# Patient Record
Sex: Male | Born: 1965 | Race: White | Hispanic: No | Marital: Single | Smoking: Current every day smoker
Health system: Southern US, Community
[De-identification: ages and names within clinical notes are randomized; demographics above are authoritative.]

## PROBLEM LIST (undated history)

## (undated) DIAGNOSIS — N182 Chronic kidney disease, stage 2 (mild): Secondary | ICD-10-CM

## (undated) DIAGNOSIS — I4891 Unspecified atrial fibrillation: Secondary | ICD-10-CM

## (undated) DIAGNOSIS — K056 Periodontal disease, unspecified: Secondary | ICD-10-CM

## (undated) DIAGNOSIS — E785 Hyperlipidemia, unspecified: Secondary | ICD-10-CM

## (undated) DIAGNOSIS — N281 Cyst of kidney, acquired: Secondary | ICD-10-CM

## (undated) DIAGNOSIS — I219 Acute myocardial infarction, unspecified: Secondary | ICD-10-CM

## (undated) DIAGNOSIS — J439 Emphysema, unspecified: Secondary | ICD-10-CM

## (undated) DIAGNOSIS — M109 Gout, unspecified: Secondary | ICD-10-CM

## (undated) DIAGNOSIS — I10 Essential (primary) hypertension: Secondary | ICD-10-CM

## (undated) DIAGNOSIS — I6529 Occlusion and stenosis of unspecified carotid artery: Secondary | ICD-10-CM

## (undated) DIAGNOSIS — R51 Headache: Secondary | ICD-10-CM

## (undated) DIAGNOSIS — R000 Tachycardia, unspecified: Secondary | ICD-10-CM

## (undated) DIAGNOSIS — I7090 Unspecified atherosclerosis: Secondary | ICD-10-CM

## (undated) DIAGNOSIS — I739 Peripheral vascular disease, unspecified: Secondary | ICD-10-CM

## (undated) DIAGNOSIS — D649 Anemia, unspecified: Secondary | ICD-10-CM

## (undated) DIAGNOSIS — I209 Angina pectoris, unspecified: Secondary | ICD-10-CM

## (undated) DIAGNOSIS — M1990 Unspecified osteoarthritis, unspecified site: Secondary | ICD-10-CM

## (undated) HISTORY — DX: Anemia, unspecified: D64.9

## (undated) HISTORY — DX: Chronic kidney disease, stage 2 (mild): N18.2

## (undated) HISTORY — DX: Essential (primary) hypertension: I10

## (undated) HISTORY — PX: CORONARY ANGIOPLASTY WITH STENT PLACEMENT: SHX49

## (undated) HISTORY — DX: Tachycardia, unspecified: R00.0

## (undated) HISTORY — PX: TONSILLECTOMY: SUR1361

## (undated) HISTORY — DX: Gout, unspecified: M10.9

## (undated) HISTORY — DX: Acute myocardial infarction, unspecified: I21.9

## (undated) HISTORY — PX: CARDIAC CATHETERIZATION: SHX172

## (undated) HISTORY — DX: Occlusion and stenosis of unspecified carotid artery: I65.29

## (undated) HISTORY — DX: Periodontal disease, unspecified: K05.6

## (undated) HISTORY — DX: Hyperlipidemia, unspecified: E78.5

---

## 2003-11-03 DIAGNOSIS — I219 Acute myocardial infarction, unspecified: Secondary | ICD-10-CM

## 2003-11-03 HISTORY — DX: Acute myocardial infarction, unspecified: I21.9

## 2012-11-02 DIAGNOSIS — J439 Emphysema, unspecified: Secondary | ICD-10-CM

## 2012-11-02 HISTORY — DX: Emphysema, unspecified: J43.9

## 2013-06-02 DIAGNOSIS — I7090 Unspecified atherosclerosis: Secondary | ICD-10-CM

## 2013-06-02 HISTORY — DX: Unspecified atherosclerosis: I70.90

## 2013-06-27 ENCOUNTER — Encounter: Payer: Self-pay | Admitting: *Deleted

## 2013-06-27 ENCOUNTER — Encounter: Payer: Self-pay | Admitting: Cardiovascular Disease

## 2013-06-28 ENCOUNTER — Encounter (HOSPITAL_COMMUNITY): Payer: Self-pay | Admitting: Pharmacy Technician

## 2013-06-28 ENCOUNTER — Encounter: Payer: Self-pay | Admitting: *Deleted

## 2013-06-28 ENCOUNTER — Encounter: Payer: Self-pay | Admitting: Cardiovascular Disease

## 2013-06-28 ENCOUNTER — Ambulatory Visit (INDEPENDENT_AMBULATORY_CARE_PROVIDER_SITE_OTHER)
Admission: RE | Admit: 2013-06-28 | Discharge: 2013-06-28 | Disposition: A | Discharge status: Home / Self Care | Payer: BC Managed Care – PPO | Source: Ambulatory Visit | Attending: Cardiovascular Disease | Admitting: Cardiovascular Disease

## 2013-06-28 ENCOUNTER — Ambulatory Visit (INDEPENDENT_AMBULATORY_CARE_PROVIDER_SITE_OTHER): Payer: BC Managed Care – PPO | Admitting: Cardiovascular Disease

## 2013-06-28 ENCOUNTER — Other Ambulatory Visit: Payer: Self-pay

## 2013-06-28 VITALS — BP 136/86 | HR 74 | Ht 69.0 in | Wt 215.6 lb

## 2013-06-28 DIAGNOSIS — I10 Essential (primary) hypertension: Secondary | ICD-10-CM | POA: Insufficient documentation

## 2013-06-28 DIAGNOSIS — I208 Other forms of angina pectoris: Secondary | ICD-10-CM | POA: Insufficient documentation

## 2013-06-28 DIAGNOSIS — IMO0001 Reserved for inherently not codable concepts without codable children: Secondary | ICD-10-CM | POA: Insufficient documentation

## 2013-06-28 DIAGNOSIS — F17200 Nicotine dependence, unspecified, uncomplicated: Secondary | ICD-10-CM | POA: Insufficient documentation

## 2013-06-28 DIAGNOSIS — R079 Chest pain, unspecified: Secondary | ICD-10-CM

## 2013-06-28 DIAGNOSIS — E785 Hyperlipidemia, unspecified: Secondary | ICD-10-CM | POA: Insufficient documentation

## 2013-06-28 LAB — CBC WITH DIFFERENTIAL/PLATELET
Basophils Absolute: 0 10*3/uL (ref 0.0–0.1)
Basophils Relative: 0.3 % (ref 0.0–3.0)
Eosinophils Absolute: 0.3 10*3/uL (ref 0.0–0.7)
HCT: 37.3 % — ABNORMAL LOW (ref 39.0–52.0)
Hemoglobin: 12.6 g/dL — ABNORMAL LOW (ref 13.0–17.0)
Lymphs Abs: 1 10*3/uL (ref 0.7–4.0)
MCHC: 33.8 g/dL (ref 30.0–36.0)
Monocytes Relative: 5.9 % (ref 3.0–12.0)
Neutro Abs: 8.6 10*3/uL — ABNORMAL HIGH (ref 1.4–7.7)
RDW: 13.9 % (ref 11.5–14.6)

## 2013-06-28 LAB — BASIC METABOLIC PANEL
BUN: 21 mg/dL (ref 6–23)
CO2: 28 mEq/L (ref 19–32)
Calcium: 9.4 mg/dL (ref 8.4–10.5)
Creatinine, Ser: 1.8 mg/dL — ABNORMAL HIGH (ref 0.4–1.5)

## 2013-06-28 LAB — PROTIME-INR: INR: 1.2 ratio — ABNORMAL HIGH (ref 0.8–1.0)

## 2013-06-28 NOTE — Patient Instructions (Addendum)
Your physician recommends that you schedule a follow-up appointment in: AFTER CATH Your physician recommends that you continue on your current medications as directed. Please refer to the Current Medication list given to you today.  Your physician has requested that you have a cardiac catheterization. Cardiac catheterization is used to diagnose and/or treat various heart conditions. Doctors may recommend this procedure for a number of different reasons. The most common reason is to evaluate chest pain. Chest pain can be a symptom of coronary artery disease (CAD), and cardiac catheterization can show whether plaque is narrowing or blocking your heart's arteries. This procedure is also used to evaluate the valves, as well as measure the blood flow and oxygen levels in different parts of your heart. For further information please visit [email or web address]. Please follow instruction sheet, as given.  Your physician recommends that you return for lab work in: TODAY  CBC WITH  DIFF INR  BMET   A chest x-ray takes a picture of the organs and structures inside the chest, including the heart, lungs, and blood vessels. This test can show several things, including, whether the heart is enlarges; whether fluid is building up in the lungs; and whether pacemaker / defibrillator leads are still in place.

## 2013-06-28 NOTE — Assessment & Plan Note (Signed)
Well controlled.  Continue current medications and low sodium Dash type diet.    

## 2013-06-28 NOTE — Assessment & Plan Note (Signed)
Cholesterol is at goal.  Continue current dose of statin and diet Rx.  No myalgias or side effects.  F/U  LFT's in 6 months. No results found for this basename: LDLCALC   LDL 74 on company records from this year Continue statin

## 2013-06-28 NOTE — Assessment & Plan Note (Signed)
Counseled for less than 10 minutes no motivation to quit  Will get CXR as part of pre cath w/u

## 2013-06-28 NOTE — Progress Notes (Signed)
Patient ID: Michael Levy, male   DOB: Sep 01, 1966, 47 y.o.   MRN: [id number] 47 yo with known CAD.  Had MI and stent at age 66.  This was at [facility] Subsequently had stents to RCA and PDA in 2010 .  He still smokes 1-2 ppd. Having exertional angina that is worsening over last 8 months  No rest pain.  Similar to previous MI pain . Center chest radiates to neck.  Works at [employer] and does not want to miss any work.    ROS: Denies fever, malais, weight loss, blurry vision, decreased visual acuity, cough, sputum, SOB, hemoptysis, pleuritic pain, palpitaitons, heartburn, abdominal pain, melena, lower extremity edema, claudication, or rash.  All other systems reviewed and negative   General: Affect appropriate Chronically ill white male with tatoos and nicotine on breath HEENT: normal Neck supple with no adenopathy JVP normal no bruits no thyromegaly Lungs clear with no wheezing and good diaphragmatic motion Heart:  S1/S2 no murmur,rub, gallop or click PMI normal Abdomen: benighn, BS positve, no tenderness, no AAA no bruit.  No HSM or HJR Distal pulses intact with no bruits No edema Neuro non-focal Skin warm and dry No muscular weakness  Medications Current Outpatient Prescriptions  Medication Sig Dispense Refill  . aspirin 325 MG tablet Take 325 mg by mouth daily.      Marland Kitchen atorvastatin (LIPITOR) 40 MG tablet Take 40 mg by mouth daily.      Marland Kitchen lisinopril-hydrochlorothiazide (PRINZIDE,ZESTORETIC) 20-25 MG per tablet Take 1 tablet by mouth daily.      . metoprolol tartrate (LOPRESSOR) 25 MG tablet Take 25 mg by mouth daily.       No current facility-administered medications for this visit.    Allergies Allopurinol  Family History: No family history on file.  Social History: History   Social History  . Marital Status: Single    Spouse Name: N/A    Number of Children: N/A  . 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  Smoking status: Current Every Day Smoker -- 1.00 packs/day    Start date: 11/02/1977  . Smokeless tobacco: Not on file  . Alcohol Use: Yes     Comment: 2 beers once a month  . Drug Use: Not on file  . Sexual Activity: Not on file   Other Topics Concern  . Not on file   Social History Narrative  . No narrative on file    Electrocardiogram: SR rate 5 old IMI anterolateral T wave changes  Assessment and Plan

## 2013-06-28 NOTE — Assessment & Plan Note (Signed)
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  Discussed cath with patient Risks including stroke discussed. Willing to proceed Would prefer radial approach.  He wants to miss minimal amount of work so Friday best.  Have scheduled with Dr Martinique in inpatient lab to facilitate ad hoc stenting.

## 2013-06-29 ENCOUNTER — Other Ambulatory Visit: Payer: Self-pay | Admitting: *Deleted

## 2013-06-29 ENCOUNTER — Ambulatory Visit (INDEPENDENT_AMBULATORY_CARE_PROVIDER_SITE_OTHER)
Admission: RE | Admit: 2013-06-29 | Discharge: 2013-06-29 | Disposition: A | Discharge status: Home / Self Care | Payer: BC Managed Care – PPO | Source: Ambulatory Visit | Attending: Cardiovascular Disease | Admitting: Cardiovascular Disease

## 2013-06-29 ENCOUNTER — Other Ambulatory Visit: Payer: Self-pay | Admitting: Cardiovascular Disease

## 2013-06-29 DIAGNOSIS — I208 Other forms of angina pectoris: Secondary | ICD-10-CM

## 2013-06-29 DIAGNOSIS — R9389 Abnormal findings on diagnostic imaging of other specified body structures: Secondary | ICD-10-CM

## 2013-06-29 DIAGNOSIS — R918 Other nonspecific abnormal finding of lung field: Secondary | ICD-10-CM

## 2013-06-29 DIAGNOSIS — R911 Solitary pulmonary nodule: Secondary | ICD-10-CM

## 2013-06-30 ENCOUNTER — Ambulatory Visit (HOSPITAL_COMMUNITY)
Admission: RE | Admit: 2013-06-30 | Discharge: 2013-06-30 | Disposition: A | Discharge status: Home / Self Care | Payer: BC Managed Care – PPO | Source: Ambulatory Visit | Attending: Cardiology | Admitting: Cardiology

## 2013-06-30 ENCOUNTER — Encounter (HOSPITAL_COMMUNITY)
Admission: RE | Disposition: A | Discharge status: Home / Self Care | Payer: Self-pay | Source: Ambulatory Visit | Attending: Cardiology

## 2013-06-30 DIAGNOSIS — Z888 Allergy status to other drugs, medicaments and biological substances status: Secondary | ICD-10-CM | POA: Insufficient documentation

## 2013-06-30 DIAGNOSIS — E785 Hyperlipidemia, unspecified: Secondary | ICD-10-CM | POA: Insufficient documentation

## 2013-06-30 DIAGNOSIS — I2510 Atherosclerotic heart disease of native coronary artery without angina pectoris: Secondary | ICD-10-CM | POA: Insufficient documentation

## 2013-06-30 DIAGNOSIS — F17200 Nicotine dependence, unspecified, uncomplicated: Secondary | ICD-10-CM | POA: Insufficient documentation

## 2013-06-30 DIAGNOSIS — I208 Other forms of angina pectoris: Secondary | ICD-10-CM

## 2013-06-30 DIAGNOSIS — I209 Angina pectoris, unspecified: Secondary | ICD-10-CM | POA: Insufficient documentation

## 2013-06-30 DIAGNOSIS — I252 Old myocardial infarction: Secondary | ICD-10-CM | POA: Insufficient documentation

## 2013-06-30 DIAGNOSIS — Z7982 Long term (current) use of aspirin: Secondary | ICD-10-CM | POA: Insufficient documentation

## 2013-06-30 DIAGNOSIS — Z9861 Coronary angioplasty status: Secondary | ICD-10-CM | POA: Insufficient documentation

## 2013-06-30 DIAGNOSIS — Z79899 Other long term (current) drug therapy: Secondary | ICD-10-CM | POA: Insufficient documentation

## 2013-06-30 DIAGNOSIS — I10 Essential (primary) hypertension: Secondary | ICD-10-CM | POA: Insufficient documentation

## 2013-06-30 HISTORY — PX: LEFT HEART CATHETERIZATION WITH CORONARY ANGIOGRAM: SHX5451

## 2013-06-30 LAB — BASIC METABOLIC PANEL
Calcium: 9.4 mg/dL (ref 8.4–10.5)
Calcium: 9.2 mg/dL (ref 8.4–10.5)
Creatinine, Ser: 1.75 mg/dL — ABNORMAL HIGH (ref 0.50–1.35)
GFR calc Af Amer: 48 mL/min — ABNORMAL LOW (ref >90)
GFR calc Af Amer: 51 mL/min — ABNORMAL LOW (ref >90)
GFR calc non Af Amer: 41 mL/min — ABNORMAL LOW (ref >90)
GFR calc non Af Amer: 44 mL/min — ABNORMAL LOW (ref >90)
Glucose, Bld: 109 mg/dL — ABNORMAL HIGH (ref 70–99)
Potassium: 4.2 mEq/L (ref 3.5–5.1)
Sodium: 139 mEq/L (ref 135–145)
Sodium: 139 mEq/L (ref 135–145)

## 2013-06-30 SURGERY — LEFT HEART CATHETERIZATION WITH CORONARY ANGIOGRAM
Anesthesia: LOCAL

## 2013-06-30 MED ORDER — SODIUM CHLORIDE 0.9 % IV SOLN: 250.0000 mL | INTRAVENOUS | Status: DC | PRN

## 2013-06-30 MED ORDER — ISOSORBIDE MONONITRATE ER 60 MG PO TB24: 60.0000 mg | ORAL_TABLET | Freq: Every day | ORAL | Status: DC

## 2013-06-30 MED ORDER — SODIUM CHLORIDE 0.9 % IV SOLN
INTRAVENOUS | Status: DC
  Administered 2013-06-30: 07:00:00 via INTRAVENOUS

## 2013-06-30 MED ORDER — NITROGLYCERIN 0.2 MG/ML ON CALL CATH LAB
INTRAVENOUS | Status: AC
  Filled 2013-06-30: qty 1

## 2013-06-30 MED ORDER — LIDOCAINE HCL (PF) 1 % IJ SOLN
INTRAMUSCULAR | Status: AC
  Filled 2013-06-30: qty 30

## 2013-06-30 MED ORDER — SODIUM CHLORIDE 0.9 % IJ SOLN: 3.0000 mL | INTRAMUSCULAR | Status: DC | PRN

## 2013-06-30 MED ORDER — VERAPAMIL HCL 2.5 MG/ML IV SOLN
INTRAVENOUS | Status: AC
  Filled 2013-06-30: qty 2

## 2013-06-30 MED ORDER — FENTANYL CITRATE 0.05 MG/ML IJ SOLN
INTRAMUSCULAR | Status: AC
  Filled 2013-06-30: qty 2

## 2013-06-30 MED ORDER — ONDANSETRON HCL 4 MG/2ML IJ SOLN: 4.0000 mg | Freq: Four times a day (QID) | INTRAMUSCULAR | Status: DC | PRN

## 2013-06-30 MED ORDER — MIDAZOLAM HCL 2 MG/2ML IJ SOLN
INTRAMUSCULAR | Status: AC
  Filled 2013-06-30: qty 2

## 2013-06-30 MED ORDER — SODIUM CHLORIDE 0.9 % IV SOLN: INTRAVENOUS | Status: DC

## 2013-06-30 MED ORDER — HEPARIN SODIUM (PORCINE) 1000 UNIT/ML IJ SOLN
INTRAMUSCULAR | Status: AC
  Filled 2013-06-30: qty 1

## 2013-06-30 MED ORDER — ACETAMINOPHEN 325 MG PO TABS: 650.0000 mg | ORAL_TABLET | ORAL | Status: DC | PRN

## 2013-06-30 MED ORDER — SODIUM CHLORIDE 0.9 % IJ SOLN: 3.0000 mL | Freq: Two times a day (BID) | INTRAMUSCULAR | Status: DC

## 2013-06-30 MED ORDER — HEPARIN (PORCINE) IN NACL 2-0.9 UNIT/ML-% IJ SOLN
INTRAMUSCULAR | Status: AC
  Filled 2013-06-30: qty 1000

## 2013-06-30 MED ORDER — ASPIRIN 81 MG PO CHEW
324.0000 mg | CHEWABLE_TABLET | ORAL | Status: AC
  Administered 2013-06-30: 324 mg via ORAL

## 2013-06-30 NOTE — CV Procedure (Signed)
   Cardiac Catheterization Procedure Note  Name: Michael Levy MRN: [id number] DOB: [birth year]  Procedure: Left Heart Cath, Selective Coronary Angiography, LV angiography  Indication: 47 year old white male with history of coronary disease status post stenting of the right coronary and PDA and 2010. He has a history of tobacco abuse and dyslipidemia. He also has a history of hypertension. He has increased symptoms of angina class III.   Procedural Details: The right wrist was prepped, draped, and anesthetized with 1% lidocaine. Using the modified Seldinger technique, a 5 French sheath was introduced into the right radial artery. 3 mg of verapamil was administered through the sheath, weight-based unfractionated heparin was administered intravenously. Standard Judkins catheters were used for selective coronary angiography and left ventriculography. Catheter exchanges were performed over an exchange length guidewire. There were no immediate procedural complications. A TR band was used for radial hemostasis at the completion of the procedure.  The patient was transferred to the post catheterization recovery area for further monitoring.  Procedural Findings: Hemodynamics: AO 130/85 a mean of 107 mmHg LV 129/20 mmHg  Coronary angiography: Coronary dominance: right  Left mainstem: The left main coronary is moderately calcified. It has no significant disease.  Left anterior descending (LAD): The LAD is a dual system with a large septal perforator branch and a large diagonal branch. There is complex bifurcation disease in the proximal LAD involving the origin of both the diagonal and septal branches to 90%. The origin of the septal branch is 95-99% with TIMI grade 2 flow.  Left circumflex (LCx): The left circumflex gives rise to a single large marginal branch and terminates in a posterior lateral branch. The first obtuse marginal branch has a long segment of disease in the proximal vessel with a more  focal area of 90%.  Right coronary artery (RCA): The right coronary is a dominant vessel. A long stent is noted from the proximal to the mid vessel. The stent is also noted in the PDA distribution. The RCA is occluded in the mid vessel following the right ventricular marginal branch. There are left to right collaterals to the distal RCA.  Left ventriculography: Left ventricular systolic function is abnormal. There is akinesis of the inferior base. There is severe hypokinesis of the entire inferior wall. Overall ejection fraction is estimated at 45%. There is no significant mitral insufficiency.  Final Conclusions:   1. Severe three-vessel obstructive coronary disease. 2. Mild to moderate left ventricular dysfunction.  Recommendations: Given his complex anatomy I would recommend coronary artery bypass surgery for revascularization.  [name] [facility] 06/30/2013, 11:14 AM

## 2013-06-30 NOTE — Progress Notes (Signed)
Assumed care of pt from Anette Guarneri, RN. Assessment documented.

## 2013-06-30 NOTE — Interval H&P Note (Signed)
History and Physical Interval Note:  06/30/2013 10:43 AM  Michael Levy  has presented today for surgery, with the diagnosis of cad  The various methods of treatment have been discussed with the patient and family. After consideration of risks, benefits and other options for treatment, the patient has consented to  Procedure(s): LEFT HEART CATHETERIZATION WITH CORONARY ANGIOGRAM (N/A) as a surgical intervention .  The patient's history has been reviewed, patient examined, no change in status, stable for surgery.  I have reviewed the patient's chart and labs.  Questions were answered to the patient's satisfaction.    Cath Lab Visit (complete for each Cath Lab visit)  Clinical Evaluation Leading to the Procedure:   ACS: no  Non-ACS:    Anginal Classification: CCS III  Anti-ischemic medical therapy: Minimal Therapy (1 class of medications)  Non-Invasive Test Results: No non-invasive testing performed  Prior CABG: No previous CABG       Michael Levy [facility] 06/30/2013 10:44 AM

## 2013-06-30 NOTE — H&P (View-Only) (Signed)
Patient ID: Michael Levy, male   DOB: 1966-07-28, 47 y.o.   MRN: [id number] 47 yo with known CAD.  Had MI and stent at age 12.  This was at [facility] Subsequently had stents to RCA and PDA in 2010 .  He still smokes 1-2 ppd. Having exertional angina that is worsening over last 8 months  No rest pain.  Similar to previous MI pain . Center chest radiates to neck.  Works at [employer] and does not want to miss any work.    ROS: Denies fever, malais, weight loss, blurry vision, decreased visual acuity, cough, sputum, SOB, hemoptysis, pleuritic pain, palpitaitons, heartburn, abdominal pain, melena, lower extremity edema, claudication, or rash.  All other systems reviewed and negative   General: Affect appropriate Chronically ill white male with tatoos and nicotine on breath HEENT: normal Neck supple with no adenopathy JVP normal no bruits no thyromegaly Lungs clear with no wheezing and good diaphragmatic motion Heart:  S1/S2 no murmur,rub, gallop or click PMI normal Abdomen: benighn, BS positve, no tenderness, no AAA no bruit.  No HSM or HJR Distal pulses intact with no bruits No edema Neuro non-focal Skin warm and dry No muscular weakness  Medications Current Outpatient Prescriptions  Medication Sig Dispense Refill  . aspirin 325 MG tablet Take 325 mg by mouth daily.      Marland Kitchen atorvastatin (LIPITOR) 40 MG tablet Take 40 mg by mouth daily.      Marland Kitchen lisinopril-hydrochlorothiazide (PRINZIDE,ZESTORETIC) 20-25 MG per tablet Take 1 tablet by mouth daily.      . metoprolol tartrate (LOPRESSOR) 25 MG tablet Take 25 mg by mouth daily.       No current facility-administered medications for this visit.    Allergies Allopurinol  Family History: No family history on file.  Social History: History   Social History  . Marital Status: Single    Spouse Name: N/A    Number of Children: N/A  . 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  Smoking status: Current Every Day Smoker -- 1.00 packs/day    Start date: 11/02/1977  . Smokeless tobacco: Not on file  . Alcohol Use: Yes     Comment: 2 beers once a month  . Drug Use: Not on file  . Sexual Activity: Not on file   Other Topics Concern  . Not on file   Social History Narrative  . No narrative on file    Electrocardiogram: SR rate 69 old IMI anterolateral T wave changes  Assessment and Plan

## 2013-07-03 DIAGNOSIS — N281 Cyst of kidney, acquired: Secondary | ICD-10-CM | POA: Insufficient documentation

## 2013-07-03 HISTORY — DX: Cyst of kidney, acquired: N28.1

## 2013-07-04 ENCOUNTER — Institutional Professional Consult (permissible substitution) (INDEPENDENT_AMBULATORY_CARE_PROVIDER_SITE_OTHER): Payer: BC Managed Care – PPO | Admitting: Thoracic Surgery (Cardiothoracic Vascular Surgery)

## 2013-07-04 ENCOUNTER — Encounter: Payer: Self-pay | Admitting: Thoracic Surgery (Cardiothoracic Vascular Surgery)

## 2013-07-04 VITALS — BP 160/84 | HR 83 | Resp 20 | Ht 69.0 in | Wt 215.0 lb

## 2013-07-04 DIAGNOSIS — Z0279 Encounter for issue of other medical certificate: Secondary | ICD-10-CM

## 2013-07-04 DIAGNOSIS — N182 Chronic kidney disease, stage 2 (mild): Secondary | ICD-10-CM

## 2013-07-04 DIAGNOSIS — I2510 Atherosclerotic heart disease of native coronary artery without angina pectoris: Secondary | ICD-10-CM

## 2013-07-04 DIAGNOSIS — N183 Chronic kidney disease, stage 3 unspecified: Secondary | ICD-10-CM | POA: Insufficient documentation

## 2013-07-04 NOTE — Progress Notes (Signed)
PCP is Pcp Not In System Referring Provider is Martinique, Peter M, MD  Chief Complaint  Patient presents with  . Coronary Artery Disease    Surgical eval, cardiac cath 06/30/13    HPI: 47 year old gentleman with a long-standing history of coronary disease who presents with chief complaint of chest pain.  Michael Levy is a 47 year old gentleman who has a history of coronary disease dating back to age 9 when he had a heart attack treated with angioplasty and stenting. About a year later he had additional stents placed in [facility]. He has multiple cardiac risk factors including tobacco abuse, hypertension, hyperlipidemia, and a strong family history with his mother requiring CABG at age 2. He started having exertional chest pain in December of 2013. He described this as a pain across his chest with exertion; it was relieved by placing his arms above his head against the wall above his head. It would radiate to his right shoulder. Recently he's had a marked increase in frequency and severity of his anginal episodes, frequently having in 3-4 times per day. Often with as little exertion and is taking a shower. He also has had some episodes at rest while watching television. He does experience shortness of breath along with the chest pain.  He saw Dr. Johnsie Cancel who recommended cardiac catheterization. That was done by Dr. Martinique on Friday. It revealed severe three-vessel coronary disease with a totally occluded right coronary, 90% stenosis in a large first obtuse marginal, and a bifurcated LAD system with significant disease in both the anterior wall and septal branches.  Past Medical History  Diagnosis Date  . Hyperlipidemia   . Gout   . Chest pain   . Anemia     Unknown cause  . Periodontal disease   . Heart attack 2005    One stent placed; 2 years later had 4 more stents placed  . Reflux   . Chest pain on exertion   . Hypertension     Essential  . Rapid heart rate   . Chronic kidney disease  (CKD), stage II (mild)     Past Surgical History  Procedure Laterality Date  . Coronary angioplasty with stent placement    . Cardiac catheterization      06/30/13 Dr Martinique    Family History  Problem Relation Age of Onset  . Diabetes Mother   . Hyperlipidemia Mother   . Hypertension Mother   . Cancer Father   . Hyperlipidemia Father   . Hypertension Father   . Stroke Father   . Diabetes Maternal Grandmother   . Coronary artery disease Mother 39    CABG at age 48    Social History History  Substance Use Topics  . Smoking status: Current Every Day Smoker -- 2.00 packs/day for 36 years    Start date: 11/02/1977  . Smokeless tobacco: Not on file  . Alcohol Use: Yes     Comment: 2 beers once a month    Current Outpatient Prescriptions  Medication Sig Dispense Refill  . aspirin 325 MG tablet Take 325 mg by mouth daily.      Marland Kitchen atorvastatin (LIPITOR) 40 MG tablet Take 40 mg by mouth daily.      . isosorbide mononitrate (IMDUR) 60 MG 24 hr tablet Take 1 tablet (60 mg total) by mouth daily.  30 tablet  6  . metoprolol tartrate (LOPRESSOR) 25 MG tablet Take 25 mg by mouth daily.       No current facility-administered medications  for this visit.    Allergies  Allergen Reactions  . Allopurinol     Kidney Failure    Review of Systems  Constitutional: Positive for activity change and fatigue. Negative for fever, chills and unexpected weight change.  Respiratory: Positive for chest tightness and shortness of breath (With chest tightness).   Cardiovascular: Positive for chest pain.  Endocrine: Negative.   Genitourinary:       Chronic kidney disease, "Dr. says it's okay now"  Musculoskeletal: Positive for joint swelling and arthralgias (Fingers, hips and knees).  Skin:       Severe skin infection last year, no current issues  Neurological: Negative.   Hematological: Does not bruise/bleed easily.  Psychiatric/Behavioral: The patient is nervous/anxious.   All other systems  reviewed and are negative.    BP 160/84  Pulse 83  Resp 20  Ht 5\' 9"  (1.753 m)  Wt 215 lb (97.523 kg)  BMI 31.74 kg/m2  SpO2 98% Physical Exam  Vitals reviewed. Constitutional: He is oriented to person, place, and time. No distress.  Overweight  HENT:  Head: Normocephalic and atraumatic.  Poor dentition with missing teeth  Eyes: EOM are normal. Pupils are equal, round, and reactive to light.  Neck: Neck supple. No thyromegaly present.  Cardiovascular: Normal rate, regular rhythm, normal heart sounds and intact distal pulses.  Exam reveals no gallop and no friction rub.   No murmur heard. Normal Allen's test left arm  Pulmonary/Chest: Breath sounds normal. He has no wheezes. He has no rales.  Abdominal: Soft. There is no tenderness.  Musculoskeletal: He exhibits no edema.  Lymphadenopathy:    He has no cervical adenopathy.  Neurological: He is alert and oriented to person, place, and time. No cranial nerve deficit.  No focal motor deficit  Skin: Skin is warm and dry.     Diagnostic Tests: CT of chest EXAM:  CT CHEST WITHOUT CONTRAST  TECHNIQUE:  Multidetector CT imaging of the chest was performed following the  standard protocol without IV contrast.  COMPARISON: Chest radiographs 06/28/2013.  FINDINGS:  There is no evidence of pulmonary nodule. Very mild emphysematous  changes are present. There is no confluent airspace opacity or  interstitial lung disease.  No enlarged mediastinal, hilar or axillary lymph nodes are  identified. There is age-advanced atherosclerosis of the aorta,  great vessels and coronary arteries. The questioned radiographic  density may be related to atherosclerosis of the arch or left  subclavian artery.  There is no pleural or pericardial effusion. No chest wall  abnormalities are demonstrated. The visualized upper abdomen appears  unremarkable.  IMPRESSION:  1. No evidence of pulmonary embolism or acute chest process.  2. Age advanced  atherosclerosis may account for the radiographic  finding.  3. Mild emphysema.  Electronically Signed  By: Camie Patience  On: 06/29/2013 16:36   Cardiac catheterization Procedural Findings:  Hemodynamics:  AO 130/85 a mean of 107 mmHg  LV 129/20 mmHg  Coronary angiography:  Coronary dominance: right  Left mainstem: The left main coronary is moderately calcified. It has no significant disease.  Left anterior descending (LAD): The LAD is a dual system with a large septal perforator branch and a large diagonal branch. There is complex bifurcation disease in the proximal LAD involving the origin of both the diagonal and septal branches to 90%. The origin of the septal branch is 95-99% with TIMI grade 2 flow.  Left circumflex (LCx): The left circumflex gives rise to a single large marginal branch and terminates  in a posterior lateral branch. The first obtuse marginal branch has a long segment of disease in the proximal vessel with a more focal area of 90%.  Right coronary artery (RCA): The right coronary is a dominant vessel. A long stent is noted from the proximal to the mid vessel. The stent is also noted in the PDA distribution. The RCA is occluded in the mid vessel following the right ventricular marginal branch. There are left to right collaterals to the distal RCA.  Left ventriculography: Left ventricular systolic function is abnormal. There is akinesis of the inferior base. There is severe hypokinesis of the entire inferior wall. Overall ejection fraction is estimated at 45%. There is no significant mitral insufficiency.  Final Conclusions:  1. Severe three-vessel obstructive coronary disease.  2. Mild to moderate left ventricular dysfunction.  Recommendations: Given his complex anatomy I would recommend coronary artery bypass surgery for revascularization.  [name] [facility]  06/30/2013, 11:14 AM  Impression: 47 year old gentleman with known coronary disease presents with progressive,  now unstable, angina. Catheterization he has severe three-vessel coronary disease not amenable to percutaneous intervention. Coronary artery bypass grafting is indicated for survival benefit and relief of symptoms.  I discussed with Mr. Ave the general nature of the procedure, the need for general anesthesia, and the incisions to be used.  We discussed the expected hospital stay, overall recovery and short and long term outcomes. We discussed the use of the left radial artery in addition to the left mammary artery and saphenous vein as conduit. We also discussed the need for lifestyle modification, particularly smoking cessation, for his long-term outcome.  I discussed with him the indications, risks, benefits, and alternatives (medical therapy). He understands the risks include, but are not limited to,  death, stroke, MI, DVT/PE, bleeding, possible need for transfusion, infections, cardiac arrhythmias, and other organ system dysfunction including respiratory, renal, or GI complications. He is at increased risk for renal complications. He understands and accepts the risks and agrees to proceed.  I encouraged him to have surgery this week as he does have some unstable symptomatology. He wished to wait until Friday, September 19. We were finally able to compromise on doing the surgery on Friday, September 12. In the meantime, I strongly advised him that if he has any chest pain is not relieved immediately with nitroglycerin that he should call 911.   Plan: CABG x4 with left radial artery on Friday, September 12.

## 2013-07-05 ENCOUNTER — Other Ambulatory Visit: Payer: Self-pay | Admitting: *Deleted

## 2013-07-05 DIAGNOSIS — I2510 Atherosclerotic heart disease of native coronary artery without angina pectoris: Secondary | ICD-10-CM

## 2013-07-12 ENCOUNTER — Ambulatory Visit (HOSPITAL_COMMUNITY)
Admission: RE | Admit: 2013-07-12 | Discharge: 2013-07-12 | Disposition: A | Discharge status: Home / Self Care | Payer: BC Managed Care – PPO | Source: Ambulatory Visit | Attending: Thoracic Surgery (Cardiothoracic Vascular Surgery) | Admitting: Thoracic Surgery (Cardiothoracic Vascular Surgery)

## 2013-07-12 ENCOUNTER — Other Ambulatory Visit (HOSPITAL_COMMUNITY): Payer: Self-pay | Admitting: *Deleted

## 2013-07-12 ENCOUNTER — Encounter (HOSPITAL_COMMUNITY): Payer: Self-pay

## 2013-07-12 ENCOUNTER — Encounter (HOSPITAL_COMMUNITY)
Admission: RE | Admit: 2013-07-12 | Discharge: 2013-07-12 | Disposition: A | Discharge status: Home / Self Care | Payer: BC Managed Care – PPO | Source: Ambulatory Visit | Attending: Thoracic Surgery (Cardiothoracic Vascular Surgery) | Admitting: Thoracic Surgery (Cardiothoracic Vascular Surgery)

## 2013-07-12 VITALS — BP 161/76 | HR 64 | Temp 98.2°F | Resp 20 | Ht 69.0 in | Wt 217.5 lb

## 2013-07-12 DIAGNOSIS — Z01810 Encounter for preprocedural cardiovascular examination: Secondary | ICD-10-CM | POA: Insufficient documentation

## 2013-07-12 DIAGNOSIS — I2510 Atherosclerotic heart disease of native coronary artery without angina pectoris: Secondary | ICD-10-CM

## 2013-07-12 DIAGNOSIS — Z01812 Encounter for preprocedural laboratory examination: Secondary | ICD-10-CM | POA: Insufficient documentation

## 2013-07-12 DIAGNOSIS — E785 Hyperlipidemia, unspecified: Secondary | ICD-10-CM | POA: Insufficient documentation

## 2013-07-12 DIAGNOSIS — Z01811 Encounter for preprocedural respiratory examination: Secondary | ICD-10-CM | POA: Insufficient documentation

## 2013-07-12 DIAGNOSIS — F17200 Nicotine dependence, unspecified, uncomplicated: Secondary | ICD-10-CM | POA: Insufficient documentation

## 2013-07-12 DIAGNOSIS — Z01818 Encounter for other preprocedural examination: Secondary | ICD-10-CM | POA: Insufficient documentation

## 2013-07-12 DIAGNOSIS — I10 Essential (primary) hypertension: Secondary | ICD-10-CM | POA: Insufficient documentation

## 2013-07-12 HISTORY — DX: Emphysema, unspecified: J43.9

## 2013-07-12 HISTORY — DX: Headache: R51

## 2013-07-12 HISTORY — DX: Unspecified osteoarthritis, unspecified site: M19.90

## 2013-07-12 LAB — SURGICAL PCR SCREEN
MRSA, PCR: NEGATIVE
Staphylococcus aureus: NEGATIVE

## 2013-07-12 LAB — URINALYSIS, ROUTINE W REFLEX MICROSCOPIC
Bilirubin Urine: NEGATIVE
Glucose, UA: NEGATIVE mg/dL
Hgb urine dipstick: NEGATIVE
Protein, ur: NEGATIVE mg/dL

## 2013-07-12 LAB — CBC
HCT: 35.2 % — ABNORMAL LOW (ref 39.0–52.0)
MCH: 30.1 pg (ref 26.0–34.0)
MCHC: 34.1 g/dL (ref 30.0–36.0)
MCV: 88.2 fL (ref 78.0–100.0)
Platelets: 216 10*3/uL (ref 150–400)
RDW: 13.4 % (ref 11.5–15.5)
WBC: 11.3 10*3/uL — ABNORMAL HIGH (ref 4.0–10.5)

## 2013-07-12 LAB — BLOOD GAS, ARTERIAL
Acid-base deficit: 2.4 mmol/L — ABNORMAL HIGH (ref 0.0–2.0)
Drawn by: 206361
Patient temperature: 98.6
TCO2: 23.6 mmol/L (ref 0–100)
pCO2 arterial: 41.7 mmHg (ref 35.0–45.0)

## 2013-07-12 LAB — TYPE AND SCREEN
ABO/RH(D): A POS
Antibody Screen: NEGATIVE

## 2013-07-12 LAB — COMPREHENSIVE METABOLIC PANEL
AST: 19 U/L (ref 0–37)
Albumin: 3.7 g/dL (ref 3.5–5.2)
BUN: 28 mg/dL — ABNORMAL HIGH (ref 6–23)
Calcium: 9.4 mg/dL (ref 8.4–10.5)
Chloride: 105 mEq/L (ref 96–112)
Creatinine, Ser: 1.8 mg/dL — ABNORMAL HIGH (ref 0.50–1.35)
Total Bilirubin: 0.1 mg/dL — ABNORMAL LOW (ref 0.3–1.2)
Total Protein: 7.5 g/dL (ref 6.0–8.3)

## 2013-07-12 LAB — PROTIME-INR: Prothrombin Time: 13.3 seconds (ref 11.6–15.2)

## 2013-07-12 LAB — ABO/RH: ABO/RH(D): A POS

## 2013-07-12 LAB — HEMOGLOBIN A1C
Hgb A1c MFr Bld: 5.8 % — ABNORMAL HIGH (ref <5.7)
Mean Plasma Glucose: 120 mg/dL — ABNORMAL HIGH (ref <117)

## 2013-07-12 LAB — APTT: aPTT: 28 seconds (ref 24–37)

## 2013-07-12 MED ORDER — ALBUTEROL SULFATE (5 MG/ML) 0.5% IN NEBU
2.5000 mg | INHALATION_SOLUTION | Freq: Once | RESPIRATORY_TRACT | Status: AC
  Administered 2013-07-12: 2.5 mg via RESPIRATORY_TRACT

## 2013-07-12 MED ORDER — CHLORHEXIDINE GLUCONATE 4 % EX LIQD: 30.0000 mL | CUTANEOUS | Status: DC

## 2013-07-12 NOTE — Pre-Procedure Instructions (Signed)
Michael Levy  07/12/2013   Your procedure is scheduled on:  July 14, 2013 at 7:30 AM  Report to [place] at 5:30 AM.  Call this number if you have problems the morning of surgery: [phone]   Remember:   Do not eat food or drink liquids after midnight.   Take these medicines the morning of surgery with A SIP OF WATER: metoprolol tartrate (LOPRESSOR), isosorbide mononitrate (IMDUR)   Stop taking Non-steroidal as of today (Motrin, Aleve, Ibuprofen, Naproxen, etc.)     Do not wear jewelry, make-up or nail polish.  Do not wear lotions, powders, or perfumes. You may wear deodorant.  Do not shave 48 hours prior to surgery. Men may shave face and neck.  Do not bring valuables to the hospital.  [facility] is not responsible                   for any belongings or valuables.  Contacts, dentures or bridgework may not be worn into surgery.  Leave suitcase in the car. After surgery it may be brought to your room.  For patients admitted to the hospital, checkout time is 11:00 AM the day of  discharge.   Special Instructions: Shower using CHG 2 nights before surgery and the night before surgery.  If you shower the day of surgery use CHG.  Use special wash - you have one bottle of CHG for all showers.  You should use approximately 1/3 of the bottle for each shower.   Please read over the following fact sheets that you were given: Pain Booklet, Coughing and Deep Breathing, Blood Transfusion Information, Open Heart Packet, MRSA Information and Surgical Site Infection Prevention

## 2013-07-12 NOTE — Progress Notes (Signed)
Pre-op Cardiac Surgery  Carotid Findings:  Findings consistent with 40-59% right internal carotid artery stenosis, and upper range 40-59% left internal carotid artery stenosis.  Upper Extremity Right Left  Brachial Pressures Triphasic-145 Triphasic- 145  Radial Waveforms Triphasic Triphasic  Ulnar Waveforms Triphasic Triphasic  Palmar Arch (Allen's Test) Signal is unaffected with radial compression, decreases less than 50% with ulnar compression. Unable to evaluate due to recent arterial stick.      Lower  Extremity Right Left  Dorsalis Pedis    Anterior Tibial    Posterior Tibial    Ankle/Brachial Indices      Findings:   Bilateral palpable pedal pulses.   07/12/2013 4:20 PM Maudry Mayhew, RVT, RDCS, RDMS

## 2013-07-13 MED ORDER — DEXMEDETOMIDINE HCL IN NACL 400 MCG/100ML IV SOLN
0.1000 ug/kg/h | INTRAVENOUS | Status: DC
  Filled 2013-07-13: qty 100

## 2013-07-13 MED ORDER — SODIUM CHLORIDE 0.9 % IV SOLN
INTRAVENOUS | Status: DC
  Filled 2013-07-13: qty 30

## 2013-07-13 MED ORDER — MAGNESIUM SULFATE 50 % IJ SOLN
40.0000 meq | INTRAMUSCULAR | Status: DC
  Filled 2013-07-13: qty 10

## 2013-07-13 MED ORDER — SODIUM CHLORIDE 0.9 % IV SOLN
INTRAVENOUS | Status: DC
  Filled 2013-07-13: qty 40

## 2013-07-13 MED ORDER — DEXTROSE 5 % IV SOLN
750.0000 mg | INTRAVENOUS | Status: DC
  Filled 2013-07-13: qty 750

## 2013-07-13 MED ORDER — SODIUM CHLORIDE 0.9 % IV SOLN
INTRAVENOUS | Status: DC
  Filled 2013-07-13: qty 1

## 2013-07-13 MED ORDER — DOPAMINE-DEXTROSE 3.2-5 MG/ML-% IV SOLN
2.0000 ug/kg/min | INTRAVENOUS | Status: DC
  Filled 2013-07-13: qty 250

## 2013-07-13 MED ORDER — EPINEPHRINE HCL 1 MG/ML IJ SOLN
0.5000 ug/min | INTRAVENOUS | Status: DC
  Filled 2013-07-13: qty 4

## 2013-07-13 MED ORDER — POTASSIUM CHLORIDE 2 MEQ/ML IV SOLN
80.0000 meq | INTRAVENOUS | Status: DC
  Filled 2013-07-13: qty 40

## 2013-07-13 MED ORDER — VANCOMYCIN HCL 10 G IV SOLR
1250.0000 mg | INTRAVENOUS | Status: AC
  Administered 2013-07-14: 1250 mg via INTRAVENOUS
  Filled 2013-07-13 (×2): qty 1250

## 2013-07-13 MED ORDER — PLASMA-LYTE 148 IV SOLN
INTRAVENOUS | Status: AC
  Administered 2013-07-14: 09:00:00
  Filled 2013-07-13: qty 2.5

## 2013-07-13 MED ORDER — DEXTROSE 5 % IV SOLN
1.5000 g | INTRAVENOUS | Status: AC
  Administered 2013-07-14: 1.5 g via INTRAVENOUS
  Administered 2013-07-14: .75 g via INTRAVENOUS
  Filled 2013-07-13 (×2): qty 1.5

## 2013-07-13 MED ORDER — PHENYLEPHRINE HCL 10 MG/ML IJ SOLN
30.0000 ug/min | INTRAVENOUS | Status: DC
  Filled 2013-07-13: qty 2

## 2013-07-13 MED ORDER — NITROGLYCERIN IN D5W 200-5 MCG/ML-% IV SOLN
2.0000 ug/min | INTRAVENOUS | Status: DC
  Filled 2013-07-13: qty 250

## 2013-07-13 NOTE — Progress Notes (Signed)
Anesthesia Chart Review:  Patient is a 47 year old male scheduled for CABG on 07/14/13 by Dr. Roxan Hockey.  History includes CAD/ MI at age 55 yr old s/p stents followed by additional stents to RCA and PDA the next year (2010) in [employer], smoking, obesity, HTN, emphysema, CKD stage II, reflux, anemia, HLD, gout, arthritis, tonsillectomy. No PCP is listed.  Cardiologist is Dr. Aviva Kluver by Dr. Martinique.  Cardiac cath on 06/30/13 showed: 1. Severe three-vessel obstructive coronary disease.  2. Mild to moderate left ventricular dysfunction. EF 45%.  Carotid duplex on 07/12/13 showed: Findings consistent with 40-59% right internal carotid artery stenosis. The left internal carotid artery demonstrates upper range 40-59% stenosis. Vertebral arteries are patent with antegrade flow.  PFTs on 07/12/13 showed: FVC 3.04 (61%), FEV1 2.45 (63%), DLCOunc 65%.  Preoperative EKG, CXR, labs noted.  BUN/Cr 28/1.80 which appear stable since 06/28/13.    Anticipate that he can proceed from an anesthesia standpoint.  George Hugh [facility]/Anesthesiology Phone [phone] 07/13/2013 10:34 AM

## 2013-07-14 ENCOUNTER — Inpatient Hospital Stay (HOSPITAL_COMMUNITY): Payer: BC Managed Care – PPO

## 2013-07-14 ENCOUNTER — Encounter (HOSPITAL_COMMUNITY): Payer: Self-pay | Admitting: Vascular Surgery

## 2013-07-14 ENCOUNTER — Inpatient Hospital Stay (HOSPITAL_COMMUNITY)
Admission: RE | Admit: 2013-07-14 | Discharge: 2013-07-24 | DRG: 546 | Disposition: A | Discharge status: Home / Self Care | Payer: BC Managed Care – PPO | Source: Ambulatory Visit | Attending: Thoracic Surgery (Cardiothoracic Vascular Surgery) | Admitting: Thoracic Surgery (Cardiothoracic Vascular Surgery)

## 2013-07-14 ENCOUNTER — Inpatient Hospital Stay (HOSPITAL_COMMUNITY): Payer: BC Managed Care – PPO | Admitting: Anesthesiology

## 2013-07-14 ENCOUNTER — Encounter (HOSPITAL_COMMUNITY)
Admission: RE | Disposition: A | Discharge status: Home / Self Care | Payer: BC Managed Care – PPO | Source: Ambulatory Visit | Attending: Thoracic Surgery (Cardiothoracic Vascular Surgery)

## 2013-07-14 ENCOUNTER — Encounter (HOSPITAL_COMMUNITY): Payer: Self-pay | Admitting: *Deleted

## 2013-07-14 DIAGNOSIS — E663 Overweight: Secondary | ICD-10-CM | POA: Diagnosis present

## 2013-07-14 DIAGNOSIS — I200 Unstable angina: Secondary | ICD-10-CM | POA: Diagnosis present

## 2013-07-14 DIAGNOSIS — I2582 Chronic total occlusion of coronary artery: Secondary | ICD-10-CM | POA: Diagnosis present

## 2013-07-14 DIAGNOSIS — I2089 Other forms of angina pectoris: Secondary | ICD-10-CM | POA: Diagnosis present

## 2013-07-14 DIAGNOSIS — I129 Hypertensive chronic kidney disease with stage 1 through stage 4 chronic kidney disease, or unspecified chronic kidney disease: Secondary | ICD-10-CM | POA: Diagnosis present

## 2013-07-14 DIAGNOSIS — R933 Abnormal findings on diagnostic imaging of other parts of digestive tract: Secondary | ICD-10-CM

## 2013-07-14 DIAGNOSIS — Z7982 Long term (current) use of aspirin: Secondary | ICD-10-CM

## 2013-07-14 DIAGNOSIS — R1084 Generalized abdominal pain: Secondary | ICD-10-CM

## 2013-07-14 DIAGNOSIS — E8779 Other fluid overload: Secondary | ICD-10-CM | POA: Diagnosis not present

## 2013-07-14 DIAGNOSIS — R339 Retention of urine, unspecified: Secondary | ICD-10-CM | POA: Diagnosis not present

## 2013-07-14 DIAGNOSIS — Z823 Family history of stroke: Secondary | ICD-10-CM

## 2013-07-14 DIAGNOSIS — D6959 Other secondary thrombocytopenia: Secondary | ICD-10-CM | POA: Diagnosis not present

## 2013-07-14 DIAGNOSIS — K219 Gastro-esophageal reflux disease without esophagitis: Secondary | ICD-10-CM | POA: Diagnosis present

## 2013-07-14 DIAGNOSIS — E785 Hyperlipidemia, unspecified: Secondary | ICD-10-CM | POA: Diagnosis present

## 2013-07-14 DIAGNOSIS — J438 Other emphysema: Secondary | ICD-10-CM | POA: Diagnosis present

## 2013-07-14 DIAGNOSIS — J9819 Other pulmonary collapse: Secondary | ICD-10-CM | POA: Diagnosis not present

## 2013-07-14 DIAGNOSIS — I700 Atherosclerosis of aorta: Secondary | ICD-10-CM | POA: Diagnosis present

## 2013-07-14 DIAGNOSIS — N170 Acute kidney failure with tubular necrosis: Secondary | ICD-10-CM | POA: Diagnosis not present

## 2013-07-14 DIAGNOSIS — I519 Heart disease, unspecified: Secondary | ICD-10-CM | POA: Diagnosis not present

## 2013-07-14 DIAGNOSIS — E876 Hypokalemia: Secondary | ICD-10-CM | POA: Diagnosis not present

## 2013-07-14 DIAGNOSIS — F17200 Nicotine dependence, unspecified, uncomplicated: Secondary | ICD-10-CM | POA: Diagnosis present

## 2013-07-14 DIAGNOSIS — M109 Gout, unspecified: Secondary | ICD-10-CM | POA: Diagnosis not present

## 2013-07-14 DIAGNOSIS — I4891 Unspecified atrial fibrillation: Secondary | ICD-10-CM | POA: Diagnosis not present

## 2013-07-14 DIAGNOSIS — K567 Ileus, unspecified: Secondary | ICD-10-CM

## 2013-07-14 DIAGNOSIS — Z79899 Other long term (current) drug therapy: Secondary | ICD-10-CM

## 2013-07-14 DIAGNOSIS — E875 Hyperkalemia: Secondary | ICD-10-CM | POA: Diagnosis not present

## 2013-07-14 DIAGNOSIS — I2510 Atherosclerotic heart disease of native coronary artery without angina pectoris: Principal | ICD-10-CM | POA: Diagnosis present

## 2013-07-14 DIAGNOSIS — Z9861 Coronary angioplasty status: Secondary | ICD-10-CM

## 2013-07-14 DIAGNOSIS — Z6833 Body mass index (BMI) 33.0-33.9, adult: Secondary | ICD-10-CM

## 2013-07-14 DIAGNOSIS — Z833 Family history of diabetes mellitus: Secondary | ICD-10-CM

## 2013-07-14 DIAGNOSIS — Y832 Surgical operation with anastomosis, bypass or graft as the cause of abnormal reaction of the patient, or of later complication, without mention of misadventure at the time of the procedure: Secondary | ICD-10-CM | POA: Diagnosis not present

## 2013-07-14 DIAGNOSIS — N183 Chronic kidney disease, stage 3 unspecified: Secondary | ICD-10-CM | POA: Diagnosis present

## 2013-07-14 DIAGNOSIS — I252 Old myocardial infarction: Secondary | ICD-10-CM

## 2013-07-14 DIAGNOSIS — I208 Other forms of angina pectoris: Secondary | ICD-10-CM | POA: Diagnosis present

## 2013-07-14 DIAGNOSIS — K929 Disease of digestive system, unspecified: Secondary | ICD-10-CM | POA: Diagnosis not present

## 2013-07-14 DIAGNOSIS — K560 Paralytic ileus: Secondary | ICD-10-CM | POA: Diagnosis not present

## 2013-07-14 DIAGNOSIS — Y9210 Unspecified residential institution as the place of occurrence of the external cause: Secondary | ICD-10-CM | POA: Diagnosis not present

## 2013-07-14 DIAGNOSIS — D62 Acute posthemorrhagic anemia: Secondary | ICD-10-CM | POA: Diagnosis not present

## 2013-07-14 DIAGNOSIS — M6282 Rhabdomyolysis: Secondary | ICD-10-CM | POA: Diagnosis not present

## 2013-07-14 DIAGNOSIS — Z8249 Family history of ischemic heart disease and other diseases of the circulatory system: Secondary | ICD-10-CM

## 2013-07-14 DIAGNOSIS — G8918 Other acute postprocedural pain: Secondary | ICD-10-CM | POA: Diagnosis not present

## 2013-07-14 DIAGNOSIS — Z951 Presence of aortocoronary bypass graft: Secondary | ICD-10-CM

## 2013-07-14 HISTORY — PX: CORONARY ARTERY BYPASS GRAFT: SHX141

## 2013-07-14 HISTORY — DX: Unspecified atrial fibrillation: I48.91

## 2013-07-14 HISTORY — DX: Angina pectoris, unspecified: I20.9

## 2013-07-14 HISTORY — DX: Unspecified atherosclerosis: I70.90

## 2013-07-14 HISTORY — DX: Cyst of kidney, acquired: N28.1

## 2013-07-14 HISTORY — PX: RADIAL ARTERY HARVEST: SHX5067

## 2013-07-14 LAB — CBC
HCT: 32.6 % — ABNORMAL LOW (ref 39.0–52.0)
MCV: 87.6 fL (ref 78.0–100.0)
Platelets: 174 10*3/uL (ref 150–400)
RBC: 3.72 MIL/uL — ABNORMAL LOW (ref 4.22–5.81)
RBC: 3.74 MIL/uL — ABNORMAL LOW (ref 4.22–5.81)
RDW: 13.4 % (ref 11.5–15.5)
RDW: 13.6 % (ref 11.5–15.5)
WBC: 21.2 10*3/uL — ABNORMAL HIGH (ref 4.0–10.5)
WBC: 22 10*3/uL — ABNORMAL HIGH (ref 4.0–10.5)

## 2013-07-14 LAB — POCT I-STAT 4, (NA,K, GLUC, HGB,HCT)
Glucose, Bld: 99 mg/dL (ref 70–99)
Glucose, Bld: 96 mg/dL (ref 70–99)
Glucose, Bld: 80 mg/dL (ref 70–99)
Glucose, Bld: 126 mg/dL — ABNORMAL HIGH (ref 70–99)
Glucose, Bld: 124 mg/dL — ABNORMAL HIGH (ref 70–99)
HCT: 34 % — ABNORMAL LOW (ref 39.0–52.0)
HCT: 31 % — ABNORMAL LOW (ref 39.0–52.0)
HCT: 27 % — ABNORMAL LOW (ref 39.0–52.0)
HCT: 27 % — ABNORMAL LOW (ref 39.0–52.0)
HCT: 33 % — ABNORMAL LOW (ref 39.0–52.0)
Hemoglobin: 10.5 g/dL — ABNORMAL LOW (ref 13.0–17.0)
Hemoglobin: 9.2 g/dL — ABNORMAL LOW (ref 13.0–17.0)
Hemoglobin: 9.2 g/dL — ABNORMAL LOW (ref 13.0–17.0)
Potassium: 5 mEq/L (ref 3.5–5.1)
Potassium: 7.1 mEq/L (ref 3.5–5.1)
Sodium: 140 mEq/L (ref 135–145)
Sodium: 140 mEq/L (ref 135–145)
Sodium: 134 mEq/L — ABNORMAL LOW (ref 135–145)
Sodium: 136 mEq/L (ref 135–145)
Sodium: 141 mEq/L (ref 135–145)

## 2013-07-14 LAB — POCT I-STAT, CHEM 8
BUN: 29 mg/dL — ABNORMAL HIGH (ref 6–23)
BUN: 27 mg/dL — ABNORMAL HIGH (ref 6–23)
BUN: 31 mg/dL — ABNORMAL HIGH (ref 6–23)
Calcium, Ion: 1.11 mmol/L — ABNORMAL LOW (ref 1.12–1.23)
Calcium, Ion: 1.12 mmol/L (ref 1.12–1.23)
Calcium, Ion: 1.12 mmol/L (ref 1.12–1.23)
Chloride: 110 mEq/L (ref 96–112)
Chloride: 109 mEq/L (ref 96–112)
Chloride: 109 mEq/L (ref 96–112)
Creatinine, Ser: 2.1 mg/dL — ABNORMAL HIGH (ref 0.50–1.35)
Glucose, Bld: 147 mg/dL — ABNORMAL HIGH (ref 70–99)
Glucose, Bld: 141 mg/dL — ABNORMAL HIGH (ref 70–99)
Glucose, Bld: 149 mg/dL — ABNORMAL HIGH (ref 70–99)
HCT: 33 % — ABNORMAL LOW (ref 39.0–52.0)
HCT: 30 % — ABNORMAL LOW (ref 39.0–52.0)
Hemoglobin: 10.2 g/dL — ABNORMAL LOW (ref 13.0–17.0)
Potassium: 5.9 mEq/L — ABNORMAL HIGH (ref 3.5–5.1)
Sodium: 142 mEq/L (ref 135–145)
TCO2: 23 mmol/L (ref 0–100)
TCO2: 23 mmol/L (ref 0–100)

## 2013-07-14 LAB — POCT I-STAT 3, ART BLOOD GAS (G3+)
Acid-base deficit: 4 mmol/L — ABNORMAL HIGH (ref 0.0–2.0)
Acid-base deficit: 6 mmol/L — ABNORMAL HIGH (ref 0.0–2.0)
Acid-base deficit: 2 mmol/L (ref 0.0–2.0)
Acid-base deficit: 4 mmol/L — ABNORMAL HIGH (ref 0.0–2.0)
Bicarbonate: 22.3 mEq/L (ref 20.0–24.0)
Bicarbonate: 22.2 mEq/L (ref 20.0–24.0)
Bicarbonate: 23.5 mEq/L (ref 20.0–24.0)
O2 Saturation: 100 %
O2 Saturation: 96 %
O2 Saturation: 93 %
O2 Saturation: 96 %
O2 Saturation: 97 %
Patient temperature: 37.3
Patient temperature: 37.9
TCO2: 24 mmol/L (ref 0–100)
TCO2: 25 mmol/L (ref 0–100)
pCO2 arterial: 42.5 mmHg (ref 35.0–45.0)
pCO2 arterial: 45.8 mmHg — ABNORMAL HIGH (ref 35.0–45.0)
pH, Arterial: 7.187 — CL (ref 7.350–7.450)
pH, Arterial: 7.322 — ABNORMAL LOW (ref 7.350–7.450)
pO2, Arterial: 321 mmHg — ABNORMAL HIGH (ref 80.0–100.0)
pO2, Arterial: 103 mmHg — ABNORMAL HIGH (ref 80.0–100.0)
pO2, Arterial: 90 mmHg (ref 80.0–100.0)

## 2013-07-14 LAB — HEMOGLOBIN AND HEMATOCRIT, BLOOD: Hemoglobin: 9.2 g/dL — ABNORMAL LOW (ref 13.0–17.0)

## 2013-07-14 LAB — APTT: aPTT: 32 seconds (ref 24–37)

## 2013-07-14 LAB — CREATININE, SERUM
Creatinine, Ser: 1.9 mg/dL — ABNORMAL HIGH (ref 0.50–1.35)
GFR calc Af Amer: 47 mL/min — ABNORMAL LOW (ref >90)
GFR calc non Af Amer: 40 mL/min — ABNORMAL LOW (ref >90)

## 2013-07-14 LAB — POCT I-STAT GLUCOSE: Glucose, Bld: 103 mg/dL — ABNORMAL HIGH (ref 70–99)

## 2013-07-14 LAB — MAGNESIUM: Magnesium: 1.7 mg/dL (ref 1.5–2.5)

## 2013-07-14 LAB — GLUCOSE, CAPILLARY: Glucose-Capillary: 95 mg/dL (ref 70–99)

## 2013-07-14 SURGERY — CORONARY ARTERY BYPASS GRAFTING (CABG)
Anesthesia: General | Site: Chest | Wound class: Clean

## 2013-07-14 MED ORDER — PHENYLEPHRINE HCL 10 MG/ML IJ SOLN
10.0000 mg | INTRAVENOUS | Status: DC | PRN
  Administered 2013-07-14: 50 ug/min via INTRAVENOUS

## 2013-07-14 MED ORDER — MORPHINE SULFATE 2 MG/ML IJ SOLN
2.0000 mg | INTRAMUSCULAR | Status: DC | PRN
  Administered 2013-07-14: 2 mg via INTRAVENOUS
  Administered 2013-07-15: 4 mg via INTRAVENOUS
  Administered 2013-07-15 (×3): 2 mg via INTRAVENOUS
  Administered 2013-07-16: 4 mg via INTRAVENOUS
  Filled 2013-07-14: qty 2
  Filled 2013-07-14 (×2): qty 1
  Filled 2013-07-14 (×2): qty 2
  Filled 2013-07-14: qty 1

## 2013-07-14 MED ORDER — METOPROLOL TARTRATE 12.5 MG HALF TABLET
12.5000 mg | ORAL_TABLET | Freq: Two times a day (BID) | ORAL | Status: DC
  Administered 2013-07-15 – 2013-07-17 (×6): 12.5 mg via ORAL
  Filled 2013-07-14 (×9): qty 1

## 2013-07-14 MED ORDER — ATORVASTATIN CALCIUM 40 MG PO TABS
40.0000 mg | ORAL_TABLET | Freq: Every day | ORAL | Status: DC
  Filled 2013-07-14: qty 1

## 2013-07-14 MED ORDER — MORPHINE SULFATE 2 MG/ML IJ SOLN: 1.0000 mg | INTRAMUSCULAR | Status: AC | PRN

## 2013-07-14 MED ORDER — ACETAMINOPHEN 500 MG PO TABS
1000.0000 mg | ORAL_TABLET | Freq: Four times a day (QID) | ORAL | Status: AC
  Administered 2013-07-15 – 2013-07-19 (×17): 1000 mg via ORAL
  Filled 2013-07-14 (×22): qty 2

## 2013-07-14 MED ORDER — PHENYLEPHRINE HCL 10 MG/ML IJ SOLN
0.0000 ug/min | INTRAVENOUS | Status: DC
  Filled 2013-07-14: qty 2

## 2013-07-14 MED ORDER — SODIUM CHLORIDE 0.9 % IJ SOLN
OROMUCOSAL | Status: DC | PRN
  Administered 2013-07-14 (×3): via TOPICAL

## 2013-07-14 MED ORDER — MAGNESIUM SULFATE 40 MG/ML IJ SOLN: 4.0000 g | Freq: Once | INTRAMUSCULAR | Status: DC

## 2013-07-14 MED ORDER — SODIUM CHLORIDE 0.9 % IJ SOLN: 3.0000 mL | INTRAMUSCULAR | Status: DC | PRN

## 2013-07-14 MED ORDER — SODIUM CHLORIDE 0.45 % IV SOLN: INTRAVENOUS | Status: DC

## 2013-07-14 MED ORDER — SODIUM CHLORIDE 0.9 % IV SOLN
100.0000 [IU] | INTRAVENOUS | Status: DC | PRN
  Administered 2013-07-14: 1.2 [IU]/h via INTRAVENOUS

## 2013-07-14 MED ORDER — LACTATED RINGERS IV SOLN
INTRAVENOUS | Status: DC | PRN
Start: 2013-07-14 — End: 2013-07-14
  Administered 2013-07-14: 07:00:00 via INTRAVENOUS

## 2013-07-14 MED ORDER — SODIUM POLYSTYRENE SULFONATE 15 GM/60ML PO SUSP
50.0000 g | Freq: Once | ORAL | Status: AC
  Administered 2013-07-14: 50 g via RECTAL
  Filled 2013-07-14: qty 240

## 2013-07-14 MED ORDER — ACETAMINOPHEN 160 MG/5ML PO SOLN
1000.0000 mg | Freq: Four times a day (QID) | ORAL | Status: AC
  Administered 2013-07-15 (×2): 1000 mg
  Filled 2013-07-14: qty 40.6

## 2013-07-14 MED ORDER — SUCCINYLCHOLINE CHLORIDE 20 MG/ML IJ SOLN
INTRAMUSCULAR | Status: DC | PRN
  Administered 2013-07-14: 100 mg via INTRAVENOUS

## 2013-07-14 MED ORDER — DEXMEDETOMIDINE HCL IN NACL 200 MCG/50ML IV SOLN
0.1000 ug/kg/h | INTRAVENOUS | Status: DC
  Administered 2013-07-14: 0.3 ug/kg/h via INTRAVENOUS
  Administered 2013-07-14: 0.7 ug/kg/h via INTRAVENOUS
  Administered 2013-07-15: 0.5 ug/kg/h via INTRAVENOUS
  Administered 2013-07-15: 0.4 ug/kg/h via INTRAVENOUS
  Filled 2013-07-14 (×4): qty 50

## 2013-07-14 MED ORDER — PROPOFOL 10 MG/ML IV BOLUS
INTRAVENOUS | Status: DC | PRN
  Administered 2013-07-14: 70 mg via INTRAVENOUS

## 2013-07-14 MED ORDER — LACTATED RINGERS IV SOLN: INTRAVENOUS | Status: DC

## 2013-07-14 MED ORDER — INSULIN REGULAR HUMAN 100 UNIT/ML IJ SOLN: INTRAMUSCULAR | Status: DC

## 2013-07-14 MED ORDER — METOPROLOL TARTRATE 25 MG/10 ML ORAL SUSPENSION
12.5000 mg | Freq: Two times a day (BID) | ORAL | Status: DC
  Filled 2013-07-14 (×9): qty 5

## 2013-07-14 MED ORDER — DOCUSATE SODIUM 100 MG PO CAPS
200.0000 mg | ORAL_CAPSULE | Freq: Every day | ORAL | Status: DC
  Administered 2013-07-16 – 2013-07-24 (×7): 200 mg via ORAL
  Filled 2013-07-14 (×10): qty 2

## 2013-07-14 MED ORDER — BISACODYL 5 MG PO TBEC
10.0000 mg | DELAYED_RELEASE_TABLET | Freq: Every day | ORAL | Status: DC
  Administered 2013-07-16 – 2013-07-21 (×6): 10 mg via ORAL
  Filled 2013-07-14 (×7): qty 2

## 2013-07-14 MED ORDER — METOPROLOL TARTRATE 1 MG/ML IV SOLN
2.5000 mg | INTRAVENOUS | Status: DC | PRN
  Administered 2013-07-17: 5 mg via INTRAVENOUS
  Filled 2013-07-14 (×2): qty 5

## 2013-07-14 MED ORDER — HEPARIN SODIUM (PORCINE) 1000 UNIT/ML IJ SOLN
INTRAMUSCULAR | Status: DC | PRN
  Administered 2013-07-14: 2 mL via INTRAVENOUS
  Administered 2013-07-14: 32 mL via INTRAVENOUS

## 2013-07-14 MED ORDER — FAMOTIDINE IN NACL 20-0.9 MG/50ML-% IV SOLN
20.0000 mg | Freq: Two times a day (BID) | INTRAVENOUS | Status: AC
  Administered 2013-07-14 – 2013-07-15 (×2): 20 mg via INTRAVENOUS
  Filled 2013-07-14: qty 50

## 2013-07-14 MED ORDER — SODIUM CHLORIDE 0.9 % IV SOLN
10.0000 g | INTRAVENOUS | Status: DC | PRN
  Administered 2013-07-14: 5 g/h via INTRAVENOUS

## 2013-07-14 MED ORDER — 0.9 % SODIUM CHLORIDE (POUR BTL) OPTIME
TOPICAL | Status: DC | PRN
  Administered 2013-07-14: 6000 mL

## 2013-07-14 MED ORDER — POTASSIUM CHLORIDE 10 MEQ/50ML IV SOLN: 10.0000 meq | INTRAVENOUS | Status: AC

## 2013-07-14 MED ORDER — SODIUM CHLORIDE 0.9 % IV SOLN: 250.0000 mL | INTRAVENOUS | Status: DC

## 2013-07-14 MED ORDER — ASPIRIN EC 325 MG PO TBEC
325.0000 mg | DELAYED_RELEASE_TABLET | Freq: Every day | ORAL | Status: DC
  Administered 2013-07-16 – 2013-07-24 (×9): 325 mg via ORAL
  Filled 2013-07-14 (×11): qty 1

## 2013-07-14 MED ORDER — INSULIN ASPART 100 UNIT/ML ~~LOC~~ SOLN
0.0000 [IU] | SUBCUTANEOUS | Status: DC
  Administered 2013-07-14: 2 [IU] via SUBCUTANEOUS

## 2013-07-14 MED ORDER — OXYCODONE HCL 5 MG PO TABS
5.0000 mg | ORAL_TABLET | ORAL | Status: DC | PRN
  Administered 2013-07-15: 10 mg via ORAL
  Administered 2013-07-17: 5 mg via ORAL
  Administered 2013-07-18: 10 mg via ORAL
  Administered 2013-07-18 – 2013-07-19 (×3): 5 mg via ORAL
  Administered 2013-07-19 – 2013-07-20 (×2): 10 mg via ORAL
  Filled 2013-07-14: qty 2
  Filled 2013-07-14 (×2): qty 1
  Filled 2013-07-14 (×5): qty 2
  Filled 2013-07-14: qty 1

## 2013-07-14 MED ORDER — MIDAZOLAM HCL 5 MG/5ML IJ SOLN
INTRAMUSCULAR | Status: DC | PRN
  Administered 2013-07-14: 2 mg via INTRAVENOUS
  Administered 2013-07-14: 5 mg via INTRAVENOUS
  Administered 2013-07-14 (×2): 3 mg via INTRAVENOUS
  Administered 2013-07-14: 4 mg via INTRAVENOUS
  Administered 2013-07-14: 3 mg via INTRAVENOUS

## 2013-07-14 MED ORDER — SODIUM CHLORIDE 0.9 % IV SOLN
200.0000 ug | INTRAVENOUS | Status: DC | PRN
  Administered 2013-07-14: 0.2 ug/kg/h via INTRAVENOUS

## 2013-07-14 MED ORDER — ACETAMINOPHEN 160 MG/5ML PO SOLN
650.0000 mg | Freq: Once | ORAL | Status: AC
  Administered 2013-07-14: 650 mg

## 2013-07-14 MED ORDER — ROCURONIUM BROMIDE 100 MG/10ML IV SOLN
INTRAVENOUS | Status: DC | PRN
  Administered 2013-07-14 (×4): 50 mg via INTRAVENOUS

## 2013-07-14 MED ORDER — ASPIRIN 81 MG PO CHEW
324.0000 mg | CHEWABLE_TABLET | Freq: Every day | ORAL | Status: DC
  Filled 2013-07-14: qty 4

## 2013-07-14 MED ORDER — SODIUM CHLORIDE 0.9 % IV SOLN: INTRAVENOUS | Status: DC

## 2013-07-14 MED ORDER — VECURONIUM BROMIDE 10 MG IV SOLR
INTRAVENOUS | Status: DC | PRN
  Administered 2013-07-14 (×2): 5 mg via INTRAVENOUS

## 2013-07-14 MED ORDER — BISACODYL 10 MG RE SUPP
10.0000 mg | Freq: Every day | RECTAL | Status: DC
  Filled 2013-07-14: qty 1

## 2013-07-14 MED ORDER — MICROFIBRILLAR COLL HEMOSTAT EX PADS
MEDICATED_PAD | CUTANEOUS | Status: DC | PRN
  Administered 2013-07-14: 1 via TOPICAL

## 2013-07-14 MED ORDER — NITROGLYCERIN IN D5W 200-5 MCG/ML-% IV SOLN: 0.0000 ug/min | INTRAVENOUS | Status: DC

## 2013-07-14 MED ORDER — ALBUMIN HUMAN 5 % IV SOLN
250.0000 mL | INTRAVENOUS | Status: AC | PRN
  Administered 2013-07-14 (×2): 250 mL via INTRAVENOUS

## 2013-07-14 MED ORDER — SODIUM CHLORIDE 0.9 % IV SOLN
INTRAVENOUS | Status: DC
  Administered 2013-07-14 – 2013-07-17 (×2): via INTRAVENOUS

## 2013-07-14 MED ORDER — INSULIN REGULAR BOLUS VIA INFUSION
0.0000 [IU] | Freq: Three times a day (TID) | INTRAVENOUS | Status: DC
  Filled 2013-07-14: qty 10

## 2013-07-14 MED ORDER — SODIUM CHLORIDE 0.9 % IV SOLN
0.5000 g/h | INTRAVENOUS | Status: DC
  Filled 2013-07-14: qty 20

## 2013-07-14 MED ORDER — PROTAMINE SULFATE 10 MG/ML IV SOLN
INTRAVENOUS | Status: DC | PRN
  Administered 2013-07-14: 100 mg via INTRAVENOUS
  Administered 2013-07-14: 10 mg via INTRAVENOUS
  Administered 2013-07-14: 100 mg via INTRAVENOUS
  Administered 2013-07-14: 90 mg via INTRAVENOUS

## 2013-07-14 MED ORDER — DEXTROSE 50 % IV SOLN: 25.0000 mL | INTRAVENOUS | Status: DC | PRN

## 2013-07-14 MED ORDER — SODIUM CHLORIDE 0.9 % IV SOLN
INTRAVENOUS | Status: DC | PRN
  Administered 2013-07-14: 13:00:00 via INTRAVENOUS

## 2013-07-14 MED ORDER — MIDAZOLAM HCL 2 MG/2ML IJ SOLN
2.0000 mg | INTRAMUSCULAR | Status: DC | PRN
  Administered 2013-07-14 (×2): 2 mg via INTRAVENOUS
  Filled 2013-07-14 (×2): qty 2

## 2013-07-14 MED ORDER — FENTANYL CITRATE 0.05 MG/ML IJ SOLN
INTRAMUSCULAR | Status: DC | PRN
  Administered 2013-07-14: 100 ug via INTRAVENOUS
  Administered 2013-07-14: 250 ug via INTRAVENOUS
  Administered 2013-07-14: 350 ug via INTRAVENOUS
  Administered 2013-07-14 (×8): 100 ug via INTRAVENOUS

## 2013-07-14 MED ORDER — LACTATED RINGERS IV SOLN
INTRAVENOUS | Status: DC | PRN
  Administered 2013-07-14 (×2): via INTRAVENOUS

## 2013-07-14 MED ORDER — LACTATED RINGERS IV SOLN
INTRAVENOUS | Status: DC | PRN
  Administered 2013-07-14: 06:00:00 via INTRAVENOUS

## 2013-07-14 MED ORDER — NITROGLYCERIN IN D5W 200-5 MCG/ML-% IV SOLN
INTRAVENOUS | Status: DC | PRN
  Administered 2013-07-14: 5 ug/min via INTRAVENOUS

## 2013-07-14 MED ORDER — SODIUM BICARBONATE 8.4 % IV SOLN
50.0000 meq | Freq: Once | INTRAVENOUS | Status: AC
  Administered 2013-07-14: 50 meq via INTRAVENOUS
  Filled 2013-07-14: qty 50

## 2013-07-14 MED ORDER — DEXTROSE-NACL 5-0.45 % IV SOLN: INTRAVENOUS | Status: DC

## 2013-07-14 MED ORDER — METOPROLOL TARTRATE 12.5 MG HALF TABLET
12.5000 mg | ORAL_TABLET | Freq: Once | ORAL | Status: AC
  Administered 2013-07-14: 12.5 mg via ORAL
  Filled 2013-07-14: qty 1

## 2013-07-14 MED ORDER — DEXTROSE 5 % IV SOLN
1.5000 g | Freq: Two times a day (BID) | INTRAVENOUS | Status: AC
  Administered 2013-07-14 – 2013-07-16 (×3): 1.5 g via INTRAVENOUS
  Filled 2013-07-14 (×4): qty 1.5

## 2013-07-14 MED ORDER — ACETAMINOPHEN 650 MG RE SUPP: 650.0000 mg | Freq: Once | RECTAL | Status: AC

## 2013-07-14 MED ORDER — DOPAMINE-DEXTROSE 3.2-5 MG/ML-% IV SOLN
INTRAVENOUS | Status: DC | PRN
  Administered 2013-07-14: 3 ug/kg/min via INTRAVENOUS

## 2013-07-14 MED ORDER — VANCOMYCIN HCL IN DEXTROSE 1-5 GM/200ML-% IV SOLN
1000.0000 mg | Freq: Once | INTRAVENOUS | Status: AC
  Administered 2013-07-14: 1000 mg via INTRAVENOUS
  Filled 2013-07-14: qty 200

## 2013-07-14 MED ORDER — LACTATED RINGERS IV SOLN: 500.0000 mL | Freq: Once | INTRAVENOUS | Status: AC | PRN

## 2013-07-14 MED ORDER — SODIUM CHLORIDE 0.9 % IJ SOLN
3.0000 mL | Freq: Two times a day (BID) | INTRAMUSCULAR | Status: DC
  Administered 2013-07-15 – 2013-07-21 (×7): 3 mL via INTRAVENOUS

## 2013-07-14 MED ORDER — ONDANSETRON HCL 4 MG/2ML IJ SOLN: 4.0000 mg | Freq: Four times a day (QID) | INTRAMUSCULAR | Status: DC | PRN

## 2013-07-14 MED ORDER — PANTOPRAZOLE SODIUM 40 MG PO TBEC
40.0000 mg | DELAYED_RELEASE_TABLET | Freq: Every day | ORAL | Status: DC
  Administered 2013-07-16 – 2013-07-24 (×9): 40 mg via ORAL
  Filled 2013-07-14 (×10): qty 1

## 2013-07-14 MED ORDER — EPHEDRINE SULFATE 50 MG/ML IJ SOLN
INTRAMUSCULAR | Status: DC | PRN
  Administered 2013-07-14: 20 mg via INTRAVENOUS

## 2013-07-14 MED ORDER — SODIUM CHLORIDE 0.9 % IV SOLN
INTRAVENOUS | Status: DC
  Filled 2013-07-14: qty 1

## 2013-07-14 SURGICAL SUPPLY — 104 items
APPLIER CLIP 9.375 SM OPEN (CLIP)
APR CLP SM 9.3 20 MLT OPN (CLIP)
ATTRACTOMAT 16X20 MAGNETIC DRP (DRAPES) ×3 IMPLANT
BAG DECANTER FOR FLEXI CONT (MISCELLANEOUS) ×3 IMPLANT
BANDAGE ELASTIC 4 VELCRO ST LF (GAUZE/BANDAGES/DRESSINGS) ×5 IMPLANT
BANDAGE ELASTIC 6 VELCRO ST LF (GAUZE/BANDAGES/DRESSINGS) ×3 IMPLANT
BANDAGE GAUZE 4  KLING STR (GAUZE/BANDAGES/DRESSINGS) ×1 IMPLANT
BANDAGE GAUZE ELAST BULKY 4 IN (GAUZE/BANDAGES/DRESSINGS) ×3 IMPLANT
BASKET HEART (ORDER IN 25'S) (MISCELLANEOUS) ×1
BASKET HEART (ORDER IN 25S) (MISCELLANEOUS) ×2 IMPLANT
BLADE STERNUM SYSTEM 6 (BLADE) ×3 IMPLANT
BLADE SURG 15 STRL LF DISP TIS (BLADE) IMPLANT
BLADE SURG 15 STRL SS (BLADE) ×3
BUCKET CAST 5QT PAPER WAX WHT (CAST SUPPLIES) ×1 IMPLANT
CANISTER SUCTION 2500CC (MISCELLANEOUS) ×3 IMPLANT
CANNULA EZ GLIDE AORTIC 21FR (CANNULA) ×3 IMPLANT
CANNULA VENOUS LOW PROF 34X46 (CANNULA) ×1 IMPLANT
CATH CPB KIT HENDRICKSON (MISCELLANEOUS) ×3 IMPLANT
CATH ROBINSON RED A/P 18FR (CATHETERS) ×3 IMPLANT
CATH THORACIC 36FR (CATHETERS) ×3 IMPLANT
CATH THORACIC 36FR RT ANG (CATHETERS) ×3 IMPLANT
CLIP APPLIE 9.375 SM OPEN (CLIP) IMPLANT
CLIP FOGARTY SPRING 6M (CLIP) ×1 IMPLANT
CLIP TI MEDIUM 24 (CLIP) IMPLANT
CLIP TI WIDE RED SMALL 24 (CLIP) ×1 IMPLANT
COVER MAYO STAND STRL (DRAPES) IMPLANT
COVER SURGICAL LIGHT HANDLE (MISCELLANEOUS) ×3 IMPLANT
CRADLE DONUT ADULT HEAD (MISCELLANEOUS) ×3 IMPLANT
DRAPE CARDIOVASCULAR INCISE (DRAPES) ×3
DRAPE EXTREMITY T 121X128X90 (DRAPE) IMPLANT
DRAPE PROXIMA HALF (DRAPES) IMPLANT
DRAPE SLUSH/WARMER DISC (DRAPES) ×3 IMPLANT
DRAPE SRG 135X102X78XABS (DRAPES) ×2 IMPLANT
DRSG COVADERM 4X14 (GAUZE/BANDAGES/DRESSINGS) ×3 IMPLANT
DRSG COVADERM 4X6 (GAUZE/BANDAGES/DRESSINGS) ×1 IMPLANT
ELECT REM PT RETURN 9FT ADLT (ELECTROSURGICAL) ×6
ELECTRODE REM PT RTRN 9FT ADLT (ELECTROSURGICAL) ×4 IMPLANT
GEL ULTRASOUND 20GR AQUASONIC (MISCELLANEOUS) IMPLANT
GLOVE BIO SURGEON STRL SZ 6.5 (GLOVE) ×4 IMPLANT
GLOVE BIO SURGEON STRL SZ7 (GLOVE) ×2 IMPLANT
GLOVE BIO SURGEON STRL SZ8 (GLOVE) ×1 IMPLANT
GLOVE BIOGEL PI IND STRL 7.0 (GLOVE) IMPLANT
GLOVE BIOGEL PI INDICATOR 7.0 (GLOVE) ×3
GLOVE EUDERMIC 7 POWDERFREE (GLOVE) ×9 IMPLANT
GOWN PREVENTION PLUS XLARGE (GOWN DISPOSABLE) ×6 IMPLANT
GOWN STRL NON-REIN LRG LVL3 (GOWN DISPOSABLE) ×12 IMPLANT
HARMONIC SHEARS 14CM COAG (MISCELLANEOUS) ×3 IMPLANT
HEMOSTAT POWDER SURGIFOAM 1G (HEMOSTASIS) ×9 IMPLANT
HEMOSTAT SURGICEL 2X14 (HEMOSTASIS) ×3 IMPLANT
INSERT FOGARTY XLG (MISCELLANEOUS) IMPLANT
IV CATH 22GX1 FEP (IV SOLUTION) ×1 IMPLANT
KIT BASIN OR (CUSTOM PROCEDURE TRAY) ×3 IMPLANT
KIT ROOM TURNOVER OR (KITS) ×6 IMPLANT
KIT SUCTION CATH 14FR (SUCTIONS) ×6 IMPLANT
KIT VASOVIEW W/TROCAR VH 2000 (KITS) ×3 IMPLANT
MARKER GRAFT CORONARY BYPASS (MISCELLANEOUS) ×10 IMPLANT
NS IRRIG 1000ML POUR BTL (IV SOLUTION) ×15 IMPLANT
PACK OPEN HEART (CUSTOM PROCEDURE TRAY) ×3 IMPLANT
PAD ARMBOARD 7.5X6 YLW CONV (MISCELLANEOUS) ×12 IMPLANT
PAD ELECT DEFIB RADIOL ZOLL (MISCELLANEOUS) ×3 IMPLANT
PENCIL BUTTON HOLSTER BLD 10FT (ELECTRODE) ×3 IMPLANT
PUNCH AORTIC ROTATE 4.0MM (MISCELLANEOUS) IMPLANT
PUNCH AORTIC ROTATE 4.5MM 8IN (MISCELLANEOUS) ×1 IMPLANT
PUNCH AORTIC ROTATE 5MM 8IN (MISCELLANEOUS) IMPLANT
SET CARDIOPLEGIA MPS 5001102 (MISCELLANEOUS) ×1 IMPLANT
SPONGE GAUZE 4X4 12PLY (GAUZE/BANDAGES/DRESSINGS) ×7 IMPLANT
SPONGE LAP 18X18 X RAY DECT (DISPOSABLE) ×1 IMPLANT
SPONGE LAP 4X18 X RAY DECT (DISPOSABLE) ×1 IMPLANT
SUT BONE WAX W31G (SUTURE) ×3 IMPLANT
SUT MNCRL AB 4-0 PS2 18 (SUTURE) ×1 IMPLANT
SUT PROLENE 3 0 SH DA (SUTURE) ×3 IMPLANT
SUT PROLENE 4 0 RB 1 (SUTURE) ×6
SUT PROLENE 4 0 SH DA (SUTURE) IMPLANT
SUT PROLENE 4-0 RB1 .5 CRCL 36 (SUTURE) IMPLANT
SUT PROLENE 6 0 C 1 30 (SUTURE) ×8 IMPLANT
SUT PROLENE 7 0 BV 1 (SUTURE) ×5 IMPLANT
SUT PROLENE 7 0 BV1 MDA (SUTURE) ×3 IMPLANT
SUT PROLENE 8 0 BV175 6 (SUTURE) ×6 IMPLANT
SUT SILK  1 MH (SUTURE)
SUT SILK 1 MH (SUTURE) IMPLANT
SUT STEEL 6MS V (SUTURE) ×1 IMPLANT
SUT STEEL STERNAL CCS#1 18IN (SUTURE) IMPLANT
SUT STEEL SZ 6 DBL 3X14 BALL (SUTURE) ×1 IMPLANT
SUT VIC AB 1 CTX 36 (SUTURE) ×6
SUT VIC AB 1 CTX36XBRD ANBCTR (SUTURE) ×4 IMPLANT
SUT VIC AB 2-0 CT1 27 (SUTURE) ×9
SUT VIC AB 2-0 CT1 TAPERPNT 27 (SUTURE) IMPLANT
SUT VIC AB 2-0 CTX 27 (SUTURE) IMPLANT
SUT VIC AB 3-0 SH 27 (SUTURE) ×3
SUT VIC AB 3-0 SH 27X BRD (SUTURE) IMPLANT
SUT VIC AB 3-0 X1 27 (SUTURE) IMPLANT
SUT VICRYL 4-0 PS2 18IN ABS (SUTURE) IMPLANT
SUTURE E-PAK OPEN HEART (SUTURE) ×3 IMPLANT
SYR 50ML SLIP (SYRINGE) IMPLANT
SYSTEM SAHARA CHEST DRAIN ATS (WOUND CARE) ×3 IMPLANT
TAPE CLOTH SURG 4X10 WHT LF (GAUZE/BANDAGES/DRESSINGS) ×1 IMPLANT
TOWEL OR 17X24 6PK STRL BLUE (TOWEL DISPOSABLE) ×6 IMPLANT
TOWEL OR 17X26 10 PK STRL BLUE (TOWEL DISPOSABLE) ×6 IMPLANT
TRAY FOLEY IC TEMP SENS 14FR (CATHETERS) ×3 IMPLANT
TUBE FEEDING 8FR 16IN STR KANG (MISCELLANEOUS) ×3 IMPLANT
TUBE SUCT INTRACARD DLP 20F (MISCELLANEOUS) ×3 IMPLANT
TUBING INSUFFLATION 10FT LAP (TUBING) ×3 IMPLANT
UNDERPAD 30X30 INCONTINENT (UNDERPADS AND DIAPERS) ×3 IMPLANT
WATER STERILE IRR 1000ML POUR (IV SOLUTION) ×6 IMPLANT

## 2013-07-14 NOTE — Preoperative (Signed)
Beta Blockers   Reason not to administer Beta Blockers:Not Applicable 

## 2013-07-14 NOTE — Progress Notes (Signed)
No attempt of wean at this time per ABG results. RT and RN will continue to monitor.

## 2013-07-14 NOTE — Interval H&P Note (Signed)
History and Physical Interval Note:  07/14/2013 7:25 AM  Michael Levy  has presented today for surgery, with the diagnosis of CAD  The various methods of treatment have been discussed with the patient and family. After consideration of risks, benefits and other options for treatment, the patient has consented to  Procedure(s): CORONARY ARTERY BYPASS GRAFTING (CABG) (N/A) RADIAL ARTERY HARVEST (Left) as a surgical intervention .  The patient's history has been reviewed, patient examined, no change in status, stable for surgery.  I have reviewed the patient's chart and labs.  Questions were answered to the patient's satisfaction.     [name] C

## 2013-07-14 NOTE — Transfer of Care (Signed)
Immediate Anesthesia Transfer of Care Note  Patient: Michael Levy  Procedure(s) Performed: Procedure(s): CORONARY ARTERY BYPASS GRAFTING (CABG) (N/A) RADIAL ARTERY HARVEST (Left)  Patient Location: SICU  Anesthesia Type:General  Level of Consciousness: Patient remains intubated per anesthesia plan  Airway & Oxygen Therapy: Patient remains intubated per anesthesia plan and Patient placed on Ventilator (see vital sign flow sheet for setting)  Post-op Assessment: Report given to PACU RN and Post -op Vital signs reviewed and stable  Post vital signs: Reviewed and stable  Complications: No apparent anesthesia complications

## 2013-07-14 NOTE — Anesthesia Preprocedure Evaluation (Addendum)
Anesthesia Evaluation  Patient identified by MRN, date of birth, ID band Patient awake    Reviewed: Allergy & Precautions, H&P , NPO status , Patient's Chart, lab work & pertinent test results  History of Anesthesia Complications Negative for: history of anesthetic complications  Airway Mallampati: III TM Distance: >3 FB Neck ROM: Full  Mouth opening: Limited Mouth Opening  Dental  (+) Edentulous Upper, Missing, Poor Dentition and Dental Advisory Given,    Pulmonary COPDCurrent Smoker,  breath sounds clear to auscultation        Cardiovascular hypertension, Pt. on home beta blockers + angina + Past MI Rhythm:Regular Rate:Normal     Neuro/Psych  Headaches,    GI/Hepatic   Endo/Other    Renal/GU Renal InsufficiencyRenal disease     Musculoskeletal   Abdominal   Peds  Hematology   Anesthesia Other Findings   Reproductive/Obstetrics                         Anesthesia Physical Anesthesia Plan  ASA: III  Anesthesia Plan: General   Post-op Pain Management:    Induction: Intravenous  Airway Management Planned: Oral ETT  Additional Equipment: Arterial line and PA Cath  Intra-op Plan:   Post-operative Plan: Post-operative intubation/ventilation  Informed Consent: I have reviewed the patients History and Physical, chart, labs and discussed the procedure including the risks, benefits and alternatives for the proposed anesthesia with the patient or authorized representative who has indicated his/her understanding and acceptance.     Plan Discussed with: CRNA and Anesthesiologist  Anesthesia Plan Comments: (3V CAD with progressive angina and previous MI age 1 and previous stenting  Rena linsufficiency Cr 1.80 Htn Smoker  Plan GA with oral ETT  Roberts Gaudy, MD)      Anesthesia Quick Evaluation

## 2013-07-14 NOTE — Progress Notes (Signed)
Pt states PFT's were done. Results not in chart. Spoke with respiratory to obtain PFT results.  Therapist on call is not able to locate results.  States he will speak with staff as soon as they come in this am and is expecting them shortly.  I informed nurse anesthetist.

## 2013-07-14 NOTE — Anesthesia Postprocedure Evaluation (Signed)
  Anesthesia Post-op Note  Patient: Michael Levy  Procedure(s) Performed: Procedure(s): CORONARY ARTERY BYPASS GRAFTING (CABG) (N/A) RADIAL ARTERY HARVEST (Left)  Patient Location: SICU  Anesthesia Type:General  Level of Consciousness: sedated and Patient remains intubated per anesthesia plan  Airway and Oxygen Therapy: Patient remains intubated per anesthesia plan and Patient placed on Ventilator (see vital sign flow sheet for setting)  Post-op Pain: none  Post-op Assessment: Post-op Vital signs reviewed and Patient's Cardiovascular Status Stable  Post-op Vital Signs: stable  Complications: No apparent anesthesia complications

## 2013-07-14 NOTE — Progress Notes (Signed)
RN notified that Pt.s ABG didn't meet the SICU protocol for weaning.  Returned the pt to original settings VT 700, SIMVrate 12, O2 50% PS 10, with PEEP 5.

## 2013-07-14 NOTE — Brief Op Note (Addendum)
      [address]       [place] [address]             [phone]     07/14/2013  12:27 PM  PATIENT:  Hilda Blades  47 y.o. male  PRE-OPERATIVE DIAGNOSIS:  CAD  POST-OPERATIVE DIAGNOSIS:  CAD  PROCEDURE:  Procedure(s): CORONARY ARTERY BYPASS GRAFTING (CABG)X5 SEQ SVG-PD-PL; LRADIAL-OM; SEQ LIMA-LAD-LAD RADIAL ARTERY HARVEST(LEFT) EVH- RIGHT THIGH  SURGEON:  Surgeon(s): Melrose Nakayama, MD Melrose Nakayama, MD  PHYSICIAN ASSISTANT: WAYNE GOLD PA-C  ANESTHESIA:   general  PATIENT CONDITION:  ICU - intubated and hemodynamically stable.  PRE-OPERATIVE WEIGHT: [id number]  COMPLICATIONS: NO KNOWN  XC= 93 min CPB= 131 min  Anterior and septal LAD branches both small caliber

## 2013-07-14 NOTE — H&P (View-Only) (Signed)
PCP is Pcp Not In System Referring Provider is Martinique, Peter M, MD  Chief Complaint  Patient presents with  . Coronary Artery Disease    Surgical eval, cardiac cath 06/30/13    HPI: 47 year old gentleman with a long-standing history of coronary disease who presents with chief complaint of chest pain.  Michael Levy is a 47 year old gentleman who has a history of coronary disease dating back to age 47 when he had a heart attack treated with angioplasty and stenting. About a year later he had additional stents placed in [facility]. He has multiple cardiac risk factors including tobacco abuse, hypertension, hyperlipidemia, and a strong family history with his mother requiring CABG at age 48. He started having exertional chest pain in December of 2013. He described this as a pain across his chest with exertion; it was relieved by placing his arms above his head against the wall above his head. It would radiate to his right shoulder. Recently he's had a marked increase in frequency and severity of his anginal episodes, frequently having in 3-4 times per day. Often with as little exertion and is taking a shower. He also has had some episodes at rest while watching television. He does experience shortness of breath along with the chest pain.  He saw Dr. Johnsie Cancel who recommended cardiac catheterization. That was done by Dr. Martinique on Friday. It revealed severe three-vessel coronary disease with a totally occluded right coronary, 90% stenosis in a large first obtuse marginal, and a bifurcated LAD system with significant disease in both the anterior wall and septal branches.  Past Medical History  Diagnosis Date  . Hyperlipidemia   . Gout   . Chest pain   . Anemia     Unknown cause  . Periodontal disease   . Heart attack 2005    One stent placed; 2 years later had 4 more stents placed  . Reflux   . Chest pain on exertion   . Hypertension     Essential  . Rapid heart rate   . Chronic kidney disease  (CKD), stage II (mild)     Past Surgical History  Procedure Laterality Date  . Coronary angioplasty with stent placement    . Cardiac catheterization      06/30/13 Dr Martinique    Family History  Problem Relation Age of Onset  . Diabetes Mother   . Hyperlipidemia Mother   . Hypertension Mother   . Cancer Father   . Hyperlipidemia Father   . Hypertension Father   . Stroke Father   . Diabetes Maternal Grandmother   . Coronary artery disease Mother 81    CABG at age 32    Social History History  Substance Use Topics  . Smoking status: Current Every Day Smoker -- 2.00 packs/day for 36 years    Start date: 11/02/1977  . Smokeless tobacco: Not on file  . Alcohol Use: Yes     Comment: 2 beers once a month    Current Outpatient Prescriptions  Medication Sig Dispense Refill  . aspirin 325 MG tablet Take 325 mg by mouth daily.      Marland Kitchen atorvastatin (LIPITOR) 40 MG tablet Take 40 mg by mouth daily.      . isosorbide mononitrate (IMDUR) 60 MG 24 hr tablet Take 1 tablet (60 mg total) by mouth daily.  30 tablet  6  . metoprolol tartrate (LOPRESSOR) 25 MG tablet Take 25 mg by mouth daily.       No current facility-administered medications  for this visit.    Allergies  Allergen Reactions  . Allopurinol     Kidney Failure    Review of Systems  Constitutional: Positive for activity change and fatigue. Negative for fever, chills and unexpected weight change.  Respiratory: Positive for chest tightness and shortness of breath (With chest tightness).   Cardiovascular: Positive for chest pain.  Endocrine: Negative.   Genitourinary:       Chronic kidney disease, "Dr. says it's okay now"  Musculoskeletal: Positive for joint swelling and arthralgias (Fingers, hips and knees).  Skin:       Severe skin infection last year, no current issues  Neurological: Negative.   Hematological: Does not bruise/bleed easily.  Psychiatric/Behavioral: The patient is nervous/anxious.   All other systems  reviewed and are negative.    BP 160/84  Pulse 83  Resp 20  Ht 5\' 9"  (1.753 m)  Wt 215 lb (97.523 kg)  BMI 31.74 kg/m2  SpO2 98% Physical Exam  Vitals reviewed. Constitutional: He is oriented to person, place, and time. No distress.  Overweight  HENT:  Head: Normocephalic and atraumatic.  Poor dentition with missing teeth  Eyes: EOM are normal. Pupils are equal, round, and reactive to light.  Neck: Neck supple. No thyromegaly present.  Cardiovascular: Normal rate, regular rhythm, normal heart sounds and intact distal pulses.  Exam reveals no gallop and no friction rub.   No murmur heard. Normal Allen's test left arm  Pulmonary/Chest: Breath sounds normal. He has no wheezes. He has no rales.  Abdominal: Soft. There is no tenderness.  Musculoskeletal: He exhibits no edema.  Lymphadenopathy:    He has no cervical adenopathy.  Neurological: He is alert and oriented to person, place, and time. No cranial nerve deficit.  No focal motor deficit  Skin: Skin is warm and dry.     Diagnostic Tests: CT of chest EXAM:  CT CHEST WITHOUT CONTRAST  TECHNIQUE:  Multidetector CT imaging of the chest was performed following the  standard protocol without IV contrast.  COMPARISON: Chest radiographs 06/28/2013.  FINDINGS:  There is no evidence of pulmonary nodule. Very mild emphysematous  changes are present. There is no confluent airspace opacity or  interstitial lung disease.  No enlarged mediastinal, hilar or axillary lymph nodes are  identified. There is age-advanced atherosclerosis of the aorta,  great vessels and coronary arteries. The questioned radiographic  density may be related to atherosclerosis of the arch or left  subclavian artery.  There is no pleural or pericardial effusion. No chest wall  abnormalities are demonstrated. The visualized upper abdomen appears  unremarkable.  IMPRESSION:  1. No evidence of pulmonary embolism or acute chest process.  2. Age advanced  atherosclerosis may account for the radiographic  finding.  3. Mild emphysema.  Electronically Signed  By: Camie Patience  On: 06/29/2013 16:36   Cardiac catheterization Procedural Findings:  Hemodynamics:  AO 130/85 a mean of 107 mmHg  LV 129/20 mmHg  Coronary angiography:  Coronary dominance: right  Left mainstem: The left main coronary is moderately calcified. It has no significant disease.  Left anterior descending (LAD): The LAD is a dual system with a large septal perforator branch and a large diagonal branch. There is complex bifurcation disease in the proximal LAD involving the origin of both the diagonal and septal branches to 90%. The origin of the septal branch is 95-99% with TIMI grade 2 flow.  Left circumflex (LCx): The left circumflex gives rise to a single large marginal branch and terminates  in a posterior lateral branch. The first obtuse marginal branch has a long segment of disease in the proximal vessel with a more focal area of 90%.  Right coronary artery (RCA): The right coronary is a dominant vessel. A long stent is noted from the proximal to the mid vessel. The stent is also noted in the PDA distribution. The RCA is occluded in the mid vessel following the right ventricular marginal branch. There are left to right collaterals to the distal RCA.  Left ventriculography: Left ventricular systolic function is abnormal. There is akinesis of the inferior base. There is severe hypokinesis of the entire inferior wall. Overall ejection fraction is estimated at 45%. There is no significant mitral insufficiency.  Final Conclusions:  1. Severe three-vessel obstructive coronary disease.  2. Mild to moderate left ventricular dysfunction.  Recommendations: Given his complex anatomy I would recommend coronary artery bypass surgery for revascularization.  [name] [facility]  06/30/2013, 11:14 AM  Impression: 47 year old gentleman with known coronary disease presents with progressive,  now unstable, angina. Catheterization he has severe three-vessel coronary disease not amenable to percutaneous intervention. Coronary artery bypass grafting is indicated for survival benefit and relief of symptoms.  I discussed with Michael Levy the general nature of the procedure, the need for general anesthesia, and the incisions to be used.  We discussed the expected hospital stay, overall recovery and short and long term outcomes. We discussed the use of the left radial artery in addition to the left mammary artery and saphenous vein as conduit. We also discussed the need for lifestyle modification, particularly smoking cessation, for his long-term outcome.  I discussed with him the indications, risks, benefits, and alternatives (medical therapy). He understands the risks include, but are not limited to,  death, stroke, MI, DVT/PE, bleeding, possible need for transfusion, infections, cardiac arrhythmias, and other organ system dysfunction including respiratory, renal, or GI complications. He is at increased risk for renal complications. He understands and accepts the risks and agrees to proceed.  I encouraged him to have surgery this week as he does have some unstable symptomatology. He wished to wait until Friday, September 19. We were finally able to compromise on doing the surgery on Friday, September 12. In the meantime, I strongly advised him that if he has any chest pain is not relieved immediately with nitroglycerin that he should call [phone].   Plan: CABG x4 with left radial artery on Friday, September 12.

## 2013-07-14 NOTE — Progress Notes (Signed)
Pt. Being weaned per SICU protocol. RN aware of the changes made.

## 2013-07-14 NOTE — Progress Notes (Signed)
Patient ID: Michael Levy, male   DOB: 12-14-1965, 47 y.o.   MRN: [id number] EVENING ROUNDS NOTE :     [address]       ,[place] [address]             [phone]                 Day of Surgery Procedure(s) (LRB): CORONARY ARTERY BYPASS GRAFTING (CABG) (N/A) RADIAL ARTERY HARVEST (Left)  Total Length of Stay:  LOS: 0 days  BP 97/62  Pulse 90  Temp(Src) 98.8 F (37.1 C) (Core (Comment))  Resp 14  Ht 5\' 9"  (1.753 m)  Wt 217 lb 8 oz (98.657 kg)  BMI 32.1 kg/m2  SpO2 100%  .Intake/Output     09/11 0701 - 09/12 0700 09/12 0701 - 09/13 0700   I.V. (mL/kg)  2966.4 (30.1)   Blood  434   NG/GT  120   IV Piggyback  600   Total Intake(mL/kg)  4120.4 (41.8)   Urine (mL/kg/hr)  1865 (1.6)   Emesis/NG output  300 (0.3)   Blood  1550 (1.3)   Chest Tube  63 (0.1)   Total Output   3778   Net   +342.4          . sodium chloride 20 mL/hr at 07/14/13 1800  . sodium chloride 20 mL/hr (07/14/13 1400)  . [START ON 07/15/2013] sodium chloride    . dexmedetomidine Stopped (07/14/13 1645)  . lactated ringers 20 mL/hr at 07/14/13 1800  . nitroGLYCERIN 7 mcg/min (07/14/13 1800)  . phenylephrine (NEO-SYNEPHRINE) Adult infusion 0 mcg/min (07/14/13 1400)     Lab Results  Component Value Date   WBC 21.2* 07/14/2013   HGB 11.2* 07/14/2013   HCT 33.0* 07/14/2013   PLT 167 07/14/2013   GLUCOSE 124* 07/14/2013   ALT 20 07/12/2013   AST 19 07/12/2013   NA 141 07/14/2013   K 5.0 07/14/2013   CL 105 07/12/2013   CREATININE 1.80* 07/12/2013   BUN 28* 07/12/2013   CO2 21 07/12/2013   INR 1.36 07/14/2013   HGBA1C 5.8* 07/12/2013   Waking up Weaning vent Not bleeding  Grace Isaac MD  Beeper [phone] Office [phone] 07/14/2013 6:44 PM

## 2013-07-14 NOTE — Progress Notes (Signed)
Pt. Being weaned per the SICU protocol. RN aware of the changes.

## 2013-07-14 NOTE — Anesthesia Procedure Notes (Signed)
Procedure Name: Intubation Date/Time: 07/14/2013 7:59 AM Performed by: Trixie Deis A Pre-anesthesia Checklist: Patient identified, Timeout performed, Emergency Drugs available, Suction available and Patient being monitored Patient Re-evaluated:Patient Re-evaluated prior to inductionOxygen Delivery Method: Circle system utilized Preoxygenation: Pre-oxygenation with 100% oxygen Intubation Type: IV induction Ventilation: Mask ventilation without difficulty and Oral airway inserted - appropriate to patient size Laryngoscope Size: Mac and 4 Grade View: Grade I Tube type: Oral Tube size: 8.0 mm Number of attempts: 1 (grade I view with cricoid pressure) Airway Equipment and Method: Stylet Placement Confirmation: ETT inserted through vocal cords under direct vision,  breath sounds checked- equal and bilateral and positive ETCO2 Secured at: 22 (teeth) cm Tube secured with: Tape Dental Injury: Teeth and Oropharynx as per pre-operative assessment

## 2013-07-15 ENCOUNTER — Inpatient Hospital Stay (HOSPITAL_COMMUNITY): Payer: BC Managed Care – PPO

## 2013-07-15 LAB — CBC
MCH: 29.9 pg (ref 26.0–34.0)
MCHC: 33.3 g/dL (ref 30.0–36.0)
MCHC: 32.5 g/dL (ref 30.0–36.0)
Platelets: 152 10*3/uL (ref 150–400)
RBC: 3.31 MIL/uL — ABNORMAL LOW (ref 4.22–5.81)
RDW: 13.7 % (ref 11.5–15.5)
RDW: 14.3 % (ref 11.5–15.5)
WBC: 15.2 10*3/uL — ABNORMAL HIGH (ref 4.0–10.5)
WBC: 20.2 10*3/uL — ABNORMAL HIGH (ref 4.0–10.5)

## 2013-07-15 LAB — POCT I-STAT, CHEM 8
Calcium, Ion: 1.19 mmol/L (ref 1.12–1.23)
Glucose, Bld: 126 mg/dL — ABNORMAL HIGH (ref 70–99)
HCT: 29 % — ABNORMAL LOW (ref 39.0–52.0)
Hemoglobin: 9.9 g/dL — ABNORMAL LOW (ref 13.0–17.0)
Potassium: 5.5 mEq/L — ABNORMAL HIGH (ref 3.5–5.1)
TCO2: 26 mmol/L (ref 0–100)

## 2013-07-15 LAB — POCT I-STAT 3, ART BLOOD GAS (G3+)
Acid-base deficit: 3 mmol/L — ABNORMAL HIGH (ref 0.0–2.0)
Acid-base deficit: 1 mmol/L (ref 0.0–2.0)
Bicarbonate: 23.8 mEq/L (ref 20.0–24.0)
Bicarbonate: 24.5 mEq/L — ABNORMAL HIGH (ref 20.0–24.0)
O2 Saturation: 94 %
O2 Saturation: 93 %
Patient temperature: 37.4
Patient temperature: 99.1
pCO2 arterial: 48.6 mmHg — ABNORMAL HIGH (ref 35.0–45.0)
pH, Arterial: 7.311 — ABNORMAL LOW (ref 7.350–7.450)
pO2, Arterial: 86 mmHg (ref 80.0–100.0)
pO2, Arterial: 64 mmHg — ABNORMAL LOW (ref 80.0–100.0)
pO2, Arterial: 76 mmHg — ABNORMAL LOW (ref 80.0–100.0)

## 2013-07-15 LAB — GLUCOSE, CAPILLARY
Glucose-Capillary: 108 mg/dL — ABNORMAL HIGH (ref 70–99)
Glucose-Capillary: 116 mg/dL — ABNORMAL HIGH (ref 70–99)
Glucose-Capillary: 118 mg/dL — ABNORMAL HIGH (ref 70–99)
Glucose-Capillary: 124 mg/dL — ABNORMAL HIGH (ref 70–99)
Glucose-Capillary: 124 mg/dL — ABNORMAL HIGH (ref 70–99)
Glucose-Capillary: 131 mg/dL — ABNORMAL HIGH (ref 70–99)
Glucose-Capillary: 137 mg/dL — ABNORMAL HIGH (ref 70–99)
Glucose-Capillary: 85 mg/dL (ref 70–99)

## 2013-07-15 LAB — BASIC METABOLIC PANEL
BUN: 29 mg/dL — ABNORMAL HIGH (ref 6–23)
BUN: 34 mg/dL — ABNORMAL HIGH (ref 6–23)
BUN: 38 mg/dL — ABNORMAL HIGH (ref 6–23)
CO2: 25 mEq/L (ref 19–32)
CO2: 26 mEq/L (ref 19–32)
Calcium: 8.5 mg/dL (ref 8.4–10.5)
Chloride: 109 mEq/L (ref 96–112)
Chloride: 105 mEq/L (ref 96–112)
Chloride: 106 mEq/L (ref 96–112)
Creatinine, Ser: 2.27 mg/dL — ABNORMAL HIGH (ref 0.50–1.35)
Creatinine, Ser: 2.62 mg/dL — ABNORMAL HIGH (ref 0.50–1.35)
GFR calc Af Amer: 45 mL/min — ABNORMAL LOW (ref >90)
GFR calc Af Amer: 38 mL/min — ABNORMAL LOW (ref >90)
GFR calc Af Amer: 32 mL/min — ABNORMAL LOW (ref >90)
GFR calc non Af Amer: 39 mL/min — ABNORMAL LOW (ref >90)
GFR calc non Af Amer: 32 mL/min — ABNORMAL LOW (ref >90)
Glucose, Bld: 121 mg/dL — ABNORMAL HIGH (ref 70–99)
Potassium: 6.2 mEq/L — ABNORMAL HIGH (ref 3.5–5.1)
Potassium: 4.9 mEq/L (ref 3.5–5.1)
Sodium: 140 mEq/L (ref 135–145)
Sodium: 141 mEq/L (ref 135–145)

## 2013-07-15 LAB — URINALYSIS, ROUTINE W REFLEX MICROSCOPIC
Glucose, UA: NEGATIVE mg/dL
Protein, ur: NEGATIVE mg/dL
Specific Gravity, Urine: 1.018 (ref 1.005–1.030)

## 2013-07-15 LAB — OSMOLALITY: Osmolality: 311 mOsm/kg — ABNORMAL HIGH (ref 275–300)

## 2013-07-15 LAB — POCT I-STAT 4, (NA,K, GLUC, HGB,HCT)
Glucose, Bld: 117 mg/dL — ABNORMAL HIGH (ref 70–99)
HCT: 31 % — ABNORMAL LOW (ref 39.0–52.0)
Potassium: 4.6 mEq/L (ref 3.5–5.1)
Sodium: 145 mEq/L (ref 135–145)

## 2013-07-15 LAB — MAGNESIUM
Magnesium: 1.8 mg/dL (ref 1.5–2.5)
Magnesium: 1.9 mg/dL (ref 1.5–2.5)
Magnesium: 1.9 mg/dL (ref 1.5–2.5)

## 2013-07-15 LAB — POTASSIUM
Potassium: 5.9 mEq/L — ABNORMAL HIGH (ref 3.5–5.1)
Potassium: 6 mEq/L — ABNORMAL HIGH (ref 3.5–5.1)
Potassium: 5.4 mEq/L — ABNORMAL HIGH (ref 3.5–5.1)

## 2013-07-15 LAB — URINE MICROSCOPIC-ADD ON

## 2013-07-15 LAB — NA AND K (SODIUM & POTASSIUM), RAND UR: Sodium, Ur: 13 mEq/L

## 2013-07-15 MED ORDER — SODIUM BICARBONATE 8.4 % IV SOLN
50.0000 meq | Freq: Once | INTRAVENOUS | Status: AC
  Administered 2013-07-15: 50 meq via INTRAVENOUS
  Filled 2013-07-15: qty 50

## 2013-07-15 MED ORDER — INSULIN ASPART 100 UNIT/ML ~~LOC~~ SOLN
0.0000 [IU] | SUBCUTANEOUS | Status: DC
  Administered 2013-07-15 (×2): 2 [IU] via SUBCUTANEOUS

## 2013-07-15 MED ORDER — FUROSEMIDE 10 MG/ML IJ SOLN
40.0000 mg | Freq: Once | INTRAMUSCULAR | Status: AC
  Administered 2013-07-15: 40 mg via INTRAVENOUS

## 2013-07-15 MED ORDER — INSULIN DETEMIR 100 UNIT/ML ~~LOC~~ SOLN
10.0000 [IU] | Freq: Once | SUBCUTANEOUS | Status: AC
  Administered 2013-07-15: 10 [IU] via SUBCUTANEOUS
  Filled 2013-07-15: qty 0.1

## 2013-07-15 MED ORDER — ISOSORBIDE MONONITRATE ER 60 MG PO TB24
60.0000 mg | ORAL_TABLET | Freq: Every day | ORAL | Status: DC
  Administered 2013-07-15 – 2013-07-17 (×3): 60 mg via ORAL
  Filled 2013-07-15 (×4): qty 1

## 2013-07-15 MED ORDER — SODIUM BICARBONATE 8.4 % IV SOLN
INTRAVENOUS | Status: AC
  Filled 2013-07-15: qty 50

## 2013-07-15 MED ORDER — INSULIN DETEMIR 100 UNIT/ML ~~LOC~~ SOLN
10.0000 [IU] | Freq: Every day | SUBCUTANEOUS | Status: DC
  Administered 2013-07-16 – 2013-07-20 (×4): 10 [IU] via SUBCUTANEOUS
  Filled 2013-07-15 (×6): qty 0.1

## 2013-07-15 MED ORDER — ATORVASTATIN CALCIUM 40 MG PO TABS
40.0000 mg | ORAL_TABLET | Freq: Every day | ORAL | Status: DC
  Administered 2013-07-15 – 2013-07-23 (×9): 40 mg via ORAL
  Filled 2013-07-15 (×11): qty 1

## 2013-07-15 MED ORDER — FUROSEMIDE 10 MG/ML IJ SOLN
INTRAMUSCULAR | Status: AC
  Filled 2013-07-15: qty 4

## 2013-07-15 NOTE — Op Note (Signed)
NAMELARANCE, VIGER NO.:  [id number]  MEDICAL RECORD NO.:  [id number]  LOCATION:  2S12C                        FACILITY:  [facility]  PHYSICIAN:  Revonda Standard. Roxan Hockey, M.D.DATE OF BIRTH:  October 26, 1966  DATE OF PROCEDURE:  07/14/2013 DATE OF DISCHARGE:                              OPERATIVE REPORT   PREOPERATIVE DIAGNOSIS:  Severe three-vessel coronary disease with progressive angina.  POSTOPERATIVE DIAGNOSIS:  Severe three-vessel coronary disease with progressive angina.  PROCEDURE:  Median sternotomy, extracorporeal circulation, coronary artery bypass grafting x5 (sequential left internal mammary artery to anterior and septal branches of LAD, left radial artery to obtuse marginal 1, sequential saphenous vein graft to posterior descending and posterior lateral), open left radial artery harvest, endoscopic saphenous vein harvest from right leg.  SURGEON:  Revonda Standard. Roxan Hockey, M.D.  ASSISTANT:  Jadene Pierini, PA-C.  ANESTHESIA:  General.  FINDINGS:  Anterior and septal branches of LAD both small, fair quality target vessels; posterolateral small, fair quality target; posterior descending and OM1 good quality targets; left ventricular hypertrophy; preserved left ventricular systolic function by echocardiogram.  CLINICAL NOTE:  Mr. Khoury is a 47 year old gentleman with a longstanding history of coronary artery disease, who recently has presented with progressive crescendo anginal symptoms.  He underwent cardiac catheterization where he was found to have severe three-vessel coronary disease.  He was referred for coronary artery bypass grafting.  The indications, risks, benefits, and alternatives were discussed in detail with the patient.  He understood and accepted the risk and agreed to proceed.  OPERATIVE NOTE:  Mr. Schlepp was brought to the preoperative holding area on July 14, 2013, there the anesthesia service placed a Swan-Ganz catheter and arterial  blood pressure monitoring line.  He was taken to the operating room, anesthetized, and intubated.  Intravenous antibiotics were administered.  A Foley catheter was placed. Transesophageal echocardiography was performed by Dr. Roberts Gaudy.  It revealed left ventricular hypertrophy with preserved left ventricular systolic function.  There was no significant valvular pathology.  The chest, abdomen, legs and left arm were prepped and draped in usual sterile fashion.  Normal Allen's test had been confirmed preoperatively.  An incision was made on the volar aspect of the left wrist over the radial artery and was carried through the skin and subcutaneous tissue.  The fascia was incised.  A short segment of the radial artery was mobilized.  There was a good pulse distally with proximal occlusion confirming the results of the preoperative evaluations.  The incision then was extended to just below the antecubital fossa and the radial artery was harvested using a Harmonic Scalpel.  Simultaneously with the radial artery harvest, a median sternotomy was performed and the left internal mammary artery was harvested using standard technique.  Both the radial and mammary artery were good quality conduits.  Heparin 2000 units was administered during the harvest of the vessels.  There was good flow through the mammary distally when it was divided.  It was placed in a papaverine soaked sponge was placed in the left chest.  After the radial artery had been mobilized along its length, and branches divided with the Harmonic scalpel, the distal end was clamped and divided.  There was excellent flow through the artery.  Papaverine was injected intraluminally and a bulldog clamp was placed on the distal end of the graft.  There was good backbleeding from the distal stump, whihc was suture ligated with a 4-0 Prolene suture.  After allowing the papaverine to dwell for several minutes, the artery then was divided  proximally and then placed into heparinized saline solution containing papaverine.  The proximal stump was suture ligated with a 4-0 Prolene suture.  The wound was copiously irrigated with warm saline, and the incision was closed in 2 layers with a running 2-0 Vicryl subcutaneous suture and a 4-0 Monocryl subcuticular suture.  While closing the radial incision, incision was made in the medial aspect of the right leg at the level of the knee. The greater saphenous vein was harvested endoscopically from the right thigh.  The saphenous vein also was a good quality conduit.  After all the conduits have been harvested, the remainder of full heparin dose was given.  The sternal retractor was placed.  The pericardium was opened.  The ascending aorta was inspected.  It was of normal caliber with no palpable atherosclerotic disease.  The aorta was cannulated via concentric 2-0 Ethibond pledgeted pursestring sutures.  A dual-stage venous cannula was placed via pursestring suture in the right atrial appendage.  After confirming adequate anticoagulation with ACT measurement cardiopulmonary bypass was initiated and the patient was cooled to 32 degrees Celsius.  The coronary arteries were inspected and anastomotic sites were chosen.  The conduits were inspected and cut to length.  A foam pad was placed the pericardium to insulate the heart and protect left phrenic nerve.  A temperature probe was placed in myocardial septum and a cardioplegia cannula was placed in the ascending aorta.  The aorta was crossclamped.  The left ventricle was emptied via the aortic root vent.  Cardiac arrest then was achieved with a combination of cold antegrade blood cardioplegia and topical iced saline.  1 L of cardioplegia was administered.  There was myocardial septal cooling to 11 degrees Celsius.  The following distal anastomoses were performed.  First, a reversed saphenous vein graft was placed sequentially to  the posterior descending branch of the right coronary and the posterolateral branch of the left circumflex.  The posterior descending was a 1.5 mm vessel.  It did have significant plaquing proximally but a probe did pass distally.  The posterolateral was a 1 mm vessel free of disease but only fair quality due to the size.  A side-to-side anastomosis was performed to the posterior descending with a running 7-0 Prolene suture. All anastomoses were probed proximally and distally at their completion prior to tying the sutures to ensure patency.  The distal end of the vein then was cut to length and an end-to-side anastomosis was performed to the posterolateral again with a 7-0 Prolene suture.  Cardioplegia then was administered down the vein graft to assess flow and hemostasis both of which were good.  Next, additional cardioplegia was given via the aortic root.  The first obtuse marginal branch of the circumflex then was exposed.  This was a 1.5 mm good quality target vessel.  The distal end of the radial artery was beveled and was anastomosed end-to-side to OM1 with a running 8-0 Prolene suture.  At the completion of anastomosis, cardioplegia was administered down the vein graft and the aortic root.  There was good backbleeding from the radial graft.   Next, the heart was elevated exposing the anterior  and septal branches of the LAD.  Both of these vessels were relatively smaller than a common LAD would have been.  The anterior branch bifurcated multiple times supplying the anterior wall.  The septal branch was a very thin vessel. The left internal mammary artery was brought through a window in the pericardium at the site where it would cross over the anterior wall branch of the LAD.  An arteriotomy was made and a side-to-side anastomosis was performed with a running 8-0 Prolene suture.  The anterior branch of the LAD did accept a 1.5 mm probe, but was only a fair quality vessel.   The mammary was of good quality.  At the completion of the anastomosis, the bulldog clamp was briefly removed.  There was good flow through the mammary as well as good flash of blood within the anterior branch of the LAD.  The bulldog clamp was replaced.  The distal end was then beveled and anastomosed end-to-side to the septal branch of the LAD with a running 8-0 Prolene suture.  The septal branch of the LAD was a very Small, 1 mm, fair quality vessel.  At the completion of the 2nd anastomosis, the bulldog clamp was again removed.  Septal rewarming was noted.  There was good hemostasis at both anastomoses.  The bulldog clamp was replaced and the mammary pedicle was tacked to the epicardial surface of the heart with 6-0 Prolene sutures.  Additional cardioplegia was administered.  The vein graft was cut to length as was the radial artery.  The cardioplegia cannula was removed from the ascending aorta and the proximal radial artery and vein graft anastomoses were performed to 4.5 mm punch aortotomies with a running 6-0 Prolene suture for the vein and 7-0 for the radial.  At the completion of the final proximal anastomosis, the patient was placed in Trendelenburg position.  100 mg of lidocaine was administered.  The bulldog clamp was removed from the left mammary artery.  Septal rewarming was again noted.  After de-airing the aortic root, the aortic crossclamp was removed.  Total crossclamp time was 93 minutes.  The patient initially was in heart block.  While rewarming was completed, all proximal and distal anastomoses were inspected for hemostasis.  Epicardial pacing wires were placed on the right ventricle and right atrium and DDD pacing was initiated at 90 beats per minute. The low-dose dopamine infusion was started at 3 mcg/kg/min.  When the patient had rewarmed to a core temperature of 37 degrees Celsius, he was weaned from cardiopulmonary bypass on the first attempt  without difficulty.  The initial cardiac index was not nearly 3 L/min/m2.  The patient remained hemodynamically stable throughout the post bypass.  Postbypass transesophageal echocardiography showed no change in left ventricular wall motion, and no significant valvular pathology.  The test dose of protamine was administered and was well tolerated.  The atrial and aortic cannulae were removed.  The remainder of the protamine was administered without incident.  The chest was irrigated with warm saline.  Hemostasis was achieved.  The pericardium was reapproximated with interrupted 3-0 silk sutures.  It came together easily without tension or kinking the underlying grafts.  A left pleural and single mediastinal chest tubes were placed via separate subcostal incisions. The sternum was closed with a combination of single and double heavy gauge stainless steel wires.  The patient tolerated the sternal closure well hemodynamically.  The pectoralis fascia, subcutaneous tissue, and skin were closed in standard fashion.  All sponge, needle, and  instrument counts were correct at the end of the procedure.  The patient was taken from the operating room to the surgical intensive care unit in good condition.     Revonda Standard Roxan Hockey, M.D.     SCH/MEDQ  D:  07/14/2013  T:  07/15/2013  Job:  [id number]

## 2013-07-15 NOTE — Progress Notes (Signed)
Results for ABG and K+ repeat are as below.  Called Dr. Servando Snare to inform of results.  Received order to repeat K+ in one hour.  Will continue to monitor pt.  Results for BECK, TESHIMA (MRN [id number]) as of 07/15/2013 01:44  Ref. Range 07/14/2013 23:24  Potassium Latest Range: 3.5-5.1 mEq/L 5.9 (H)   Results for BAILY, MAGRO (MRN [id number]) as of 07/15/2013 01:44  Ref. Range 07/14/2013 23:21  Sample type No range found ARTERIAL  pH, Arterial Latest Range: 7.350-7.450  7.322 (L)  pCO2 arterial Latest Range: 35.0-45.0 mmHg 45.8 (H)  pO2, Arterial Latest Range: 80.0-100.0 mmHg 90.0  Bicarbonate Latest Range: 20.0-24.0 mEq/L 23.5  TCO2 Latest Range: 0-100 mmol/L 25  Acid-base deficit Latest Range: 0.0-2.0 mmol/L 2.0  O2 Saturation No range found 96.0  Patient temperature No range found 37.9 C  Collection site No range found ARTERIAL LINE

## 2013-07-15 NOTE — Progress Notes (Signed)
Pt is awake and follows commands.  Last ABG w/ acidosis.  Per RN, waiting on MD to round on pt this morning.  Upon entering room, RN reports A-line not working, unable to get wave form, unable to flush, or draw blood.  Attempted to redress, and reposition a line w/ no success.  Aline removed.

## 2013-07-15 NOTE — Procedures (Signed)
Extubation Procedure Note  Patient Details:   Name: Nickolos Loughran DOB: 1966-04-07 MRN: [id number]   Airway Documentation:    NIF -30, VC 500 ml, + air leak around cuff.  MD given ABG results. Per V.O. Dr Servando Snare, proceed to extubate.   Evaluation  O2 sats: stable throughout Complications: No apparent complications Patient did tolerate procedure well. Bilateral Breath Sounds: Clear;Diminished Suctioning: Airway Yes, pt able to speak.  No stridor noted, BBSH diminished clear, pt makes an upper airway noise/wheeze sound w/ expiration however, pt states this is normal for him.  No distress noted.  VSS.    Lenna Sciara 07/15/2013, 10:39 AM

## 2013-07-15 NOTE — Progress Notes (Signed)
Patient ID: Michael Levy, male   DOB: 1966-08-23, 47 y.o.   MRN: [id number] EVENING ROUNDS NOTE :     [address]       [place] [address]             [phone]                 1 Day Post-Op Procedure(s) (LRB): CORONARY ARTERY BYPASS GRAFTING (CABG) (N/A) RADIAL ARTERY HARVEST (Left)  Total Length of Stay:  LOS: 1 day  BP 120/75  Pulse 90  Temp(Src) 97.6 F (36.4 C) (Oral)  Resp 32  Ht 5\' 9"  (1.753 m)  Wt 234 lb 9.1 oz (106.4 kg)  BMI 34.62 kg/m2  SpO2 96%  .Intake/Output     09/12 0701 - 09/13 0700 09/13 0701 - 09/14 0700   I.V. (mL/kg) 3612.4 (34) 36.1 (0.3)   Blood 434    NG/GT 150    IV Piggyback 800    Total Intake(mL/kg) 4996.4 (47) 36.1 (0.3)   Urine (mL/kg/hr) 2965 (1.2) 730 (0.7)   Emesis/NG output 300 (0.1) 50 (0)   Blood 1550 (0.6)    Chest Tube 188 (0.1) 90 (0.1)   Total Output 5003 870   Net -6.6 -833.9          . sodium chloride 20 mL/hr (07/15/13 0800)  . sodium chloride 20 mL/hr (07/15/13 0800)  . sodium chloride    . sodium chloride 20 mL/hr at 07/14/13 2315  . dexmedetomidine Stopped (07/15/13 0900)  . dextrose 5 % and 0.45% NaCl    . insulin (NOVOLIN-R) infusion 0.5 mL/hr at 07/15/13 0700  . lactated ringers 20 mL/hr at 07/14/13 1800  . nitroGLYCERIN 7 mcg/min (07/15/13 0800)  . phenylephrine (NEO-SYNEPHRINE) Adult infusion Stopped (07/14/13 2200)     Lab Results  Component Value Date   WBC 15.2* 07/15/2013   HGB 9.9* 07/15/2013   HCT 29.0* 07/15/2013   PLT 152 07/15/2013   GLUCOSE 126* 07/15/2013   ALT 20 07/12/2013   AST 19 07/12/2013   NA 144 07/15/2013   K 5.5* 07/15/2013   CL 107 07/15/2013   CREATININE 2.50* 07/15/2013   BUN 39* 07/15/2013   CO2 25 07/15/2013   INR 1.36 07/14/2013   HGBA1C 5.8* 07/12/2013   Extubated Neuro intact CR increasing now 2.5   Grace Isaac MD  Beeper [phone] Office [phone] 07/15/2013 5:22 PM

## 2013-07-15 NOTE — Progress Notes (Signed)
Pt is awake, alert, and following commands.  RN requested to start SICU wean protocol d/t MD rounding on pts in unit, and wants pt weaning when MD rounds on this pt.  No distress noted, Pt appears comfortable.

## 2013-07-15 NOTE — Progress Notes (Signed)
Received critical value of K+ >7.5.  Called Dr. Servando Snare to advised.  Received orders for 1 amp Bi Carb, restart insulin gtt, 50 gram kayexalate via enema, and repeat ABG and K+ 1 hour after orders completed.  Will implement orders and continue to to monitor pt.

## 2013-07-15 NOTE — Progress Notes (Signed)
Repeat K+ result = 6.0.  Per TCTS protocol no need to notify MD.  Will continue to monitor.

## 2013-07-15 NOTE — Consult Note (Signed)
Reason for Consult: Hyperkalemia Referring Physician: Dr. Servando Snare  HPI:  Michael Levy is a 47 y.o. male admitted for CABG which was performed on 07/14/2013. He has a long history of CAD with his first MI at the age of 47 years old which was treated with angioplasty and stent. He began to have typical angina in December of 2013 which has become increasingly frequent since. Lately the angina has begun to occur with less and less exertion and occasionally at rest. Cardiology performed a cath on 8/29 finding  3 vessel disease and referred him for CABG.    Since the procedure he has developed hyperkalemia overnight to 7.5 which has now trended down to 4.6 on the last check. He was treated with a kayexelate enema and insulin drip overnight. He did not develop and cardiac side effects from this hyperK. He has been weaned from his pressors and extubated last night and this am resp.   His family states that he has known renal insufficieny they believe to be due to some gout medicine he was taking more than 1 year ago. They state that he has been mentating well and interacting with them since the extubation. He is having current chest pain, as expected after thoracotomy.    Past Medical History  Diagnosis Date  . Hyperlipidemia   . Gout   . Chest pain   . Anemia     Unknown cause  . Periodontal disease   . Heart attack 2005    One stent placed; 2 years later had 4 more stents placed  . Reflux   . Chest pain on exertion   . Hypertension     Essential  . Rapid heart rate   . Chronic kidney disease (CKD), stage II (mild)   . Emphysema 2014  . Headache(784.0)     occasional migraines  . Arthritis     Past Surgical History  Procedure Laterality Date  . Coronary angioplasty with stent placement    . Cardiac catheterization      06/30/13 Dr [place]  . Tonsillectomy      Family History  Problem Relation Age of Onset  . Diabetes Mother   . Hyperlipidemia Mother   . Hypertension Mother   .  Cancer Father   . Hyperlipidemia Father   . Hypertension Father   . Stroke Father   . Diabetes Maternal Grandmother   . Coronary artery disease Mother 25    CABG at age 21    Social History:  reports that he has been smoking Cigarettes.  He started smoking about 35 years ago. He has a 72 pack-year smoking history. He has quit using smokeless tobacco. He reports that  drinks alcohol. He reports that he does not use illicit drugs.  Allergies:  Allergies  Allergen Reactions  . Allopurinol     Kidney Failure    Medications: I have reviewed the patient's current medications.   Results for orders placed during the hospital encounter of 07/14/13 (from the past 48 hour(s))  POCT I-STAT 4, (NA,K, GLUC, HGB,HCT)     Status: Abnormal   Collection Time    07/14/13  8:03 AM      Result Value Range   Sodium 140  135 - 145 mEq/L   Potassium 5.6 (*) 3.5 - 5.1 mEq/L   Glucose, Bld 99  70 - 99 mg/dL   HCT 34.0 (*) 39.0 - 52.0 %   Hemoglobin 11.6 (*) 13.0 - 17.0 g/dL  POCT I-STAT GLUCOSE  Status: Abnormal   Collection Time    07/14/13  9:53 AM      Result Value Range   Operator id [id number]     Glucose, Bld 103 (*) 70 - 99 mg/dL  POCT I-STAT 4, (NA,K, GLUC, HGB,HCT)     Status: Abnormal   Collection Time    07/14/13 10:25 AM      Result Value Range   Sodium 140  135 - 145 mEq/L   Potassium 5.7 (*) 3.5 - 5.1 mEq/L   Glucose, Bld 96  70 - 99 mg/dL   HCT 31.0 (*) 39.0 - 52.0 %   Hemoglobin 10.5 (*) 13.0 - 17.0 g/dL  POCT I-STAT 3, BLOOD GAS (G3+)     Status: Abnormal   Collection Time    07/14/13 10:45 AM      Result Value Range   pH, Arterial 7.313 (*) 7.350 - 7.450   pCO2 arterial 42.5  35.0 - 45.0 mmHg   pO2, Arterial 321.0 (*) 80.0 - 100.0 mmHg   Bicarbonate 21.5  20.0 - 24.0 mEq/L   TCO2 23  0 - 100 mmol/L   O2 Saturation 100.0     Acid-base deficit 4.0 (*) 0.0 - 2.0 mmol/L   Sample type ARTERIAL    POCT I-STAT 4, (NA,K, GLUC, HGB,HCT)     Status: Abnormal   Collection Time     07/14/13 10:49 AM      Result Value Range   Sodium 134 (*) 135 - 145 mEq/L   Potassium 5.0  3.5 - 5.1 mEq/L   Glucose, Bld 80  70 - 99 mg/dL   HCT 26.0 (*) 39.0 - 52.0 %   Hemoglobin 8.8 (*) 13.0 - 17.0 g/dL  HEMOGLOBIN AND HEMATOCRIT, BLOOD     Status: Abnormal   Collection Time    07/14/13 12:01 PM      Result Value Range   Hemoglobin 9.2 (*) 13.0 - 17.0 g/dL   Comment: RESULT CALLED TO, READ BACK BY AND VERIFIED WITH:     REPEATED TO VERIFY     MCCLELLAN,K RN 1218 07/14/13 LEONARD,A   HCT 26.5 (*) 39.0 - 52.0 %   Comment: RESULT CALLED TO, READ BACK BY AND VERIFIED WITH:     REPEATED TO VERIFY     [facility] RN 1218 07/14/13 LEONARD,A  PLATELET COUNT     Status: Abnormal   Collection Time    07/14/13 12:01 PM      Result Value Range   Platelets 146 (*) 150 - 400 K/uL   Comment: RESULT CALLED TO, READ BACK BY AND VERIFIED WITH:     REPEATED TO VERIFY     MCCLELLAN,K RN 1218 07/14/13 LEONARD,A  POCT I-STAT 4, (NA,K, GLUC, HGB,HCT)     Status: Abnormal   Collection Time    07/14/13 12:06 PM      Result Value Range   Sodium 136  135 - 145 mEq/L   Potassium 6.9 (*) 3.5 - 5.1 mEq/L   Glucose, Bld 116 (*) 70 - 99 mg/dL   HCT 27.0 (*) 39.0 - 52.0 %   Hemoglobin 9.2 (*) 13.0 - 17.0 g/dL  POCT I-STAT 4, (NA,K, GLUC, HGB,HCT)     Status: Abnormal   Collection Time    07/14/13 12:30 PM      Result Value Range   Sodium 136  135 - 145 mEq/L   Potassium 7.1 (*) 3.5 - 5.1 mEq/L   Glucose, Bld 126 (*) 70 - 99 mg/dL  HCT 27.0 (*) 39.0 - 52.0 %   Hemoglobin 9.2 (*) 13.0 - 17.0 g/dL  POCT I-STAT 4, (NA,K, GLUC, HGB,HCT)     Status: Abnormal   Collection Time    07/14/13  1:07 PM      Result Value Range   Sodium 138  135 - 145 mEq/L   Potassium 5.9 (*) 3.5 - 5.1 mEq/L   Glucose, Bld 146 (*) 70 - 99 mg/dL   HCT 27.0 (*) 39.0 - 52.0 %   Hemoglobin 9.2 (*) 13.0 - 17.0 g/dL  CBC     Status: Abnormal   Collection Time    07/14/13  2:00 PM      Result Value Range   WBC 21.2 (*)  4.0 - 10.5 K/uL   RBC 3.72 (*) 4.22 - 5.81 MIL/uL   Hemoglobin 11.4 (*) 13.0 - 17.0 g/dL   Comment: REPEATED TO VERIFY     DELTA CHECK NOTED   HCT 32.6 (*) 39.0 - 52.0 %   MCV 87.6  78.0 - 100.0 fL   MCH 30.6  26.0 - 34.0 pg   MCHC 35.0  30.0 - 36.0 g/dL   RDW 13.4  11.5 - 15.5 %   Platelets 167  150 - 400 K/uL  PROTIME-INR     Status: Abnormal   Collection Time    07/14/13  2:00 PM      Result Value Range   Prothrombin Time 16.4 (*) 11.6 - 15.2 seconds   INR 1.36  0.00 - 1.49  APTT     Status: None   Collection Time    07/14/13  2:00 PM      Result Value Range   aPTT 32  24 - 37 seconds  POCT I-STAT 4, (NA,K, GLUC, HGB,HCT)     Status: Abnormal   Collection Time    07/14/13  2:06 PM      Result Value Range   Sodium 141  135 - 145 mEq/L   Potassium 5.0  3.5 - 5.1 mEq/L   Glucose, Bld 124 (*) 70 - 99 mg/dL   HCT 33.0 (*) 39.0 - 52.0 %   Hemoglobin 11.2 (*) 13.0 - 17.0 g/dL  POCT I-STAT 3, BLOOD GAS (G3+)     Status: Abnormal   Collection Time    07/14/13  2:10 PM      Result Value Range   pH, Arterial 7.307 (*) 7.350 - 7.450   pCO2 arterial 39.6  35.0 - 45.0 mmHg   pO2, Arterial 87.0  80.0 - 100.0 mmHg   Bicarbonate 19.8 (*) 20.0 - 24.0 mEq/L   TCO2 21  0 - 100 mmol/L   O2 Saturation 96.0     Acid-base deficit 6.0 (*) 0.0 - 2.0 mmol/L   Patient temperature 36.9 C     Sample type ARTERIAL    GLUCOSE, CAPILLARY     Status: None   Collection Time    07/14/13  3:38 PM      Result Value Range   Glucose-Capillary 93  70 - 99 mg/dL  GLUCOSE, CAPILLARY     Status: None   Collection Time    07/14/13  4:33 PM      Result Value Range   Glucose-Capillary 95  70 - 99 mg/dL  GLUCOSE, CAPILLARY     Status: None   Collection Time    07/14/13  5:27 PM      Result Value Range   Glucose-Capillary 99  70 - 99 mg/dL  POCT I-STAT  3, BLOOD GAS (G3+)     Status: Abnormal   Collection Time    07/14/13  6:54 PM      Result Value Range   pH, Arterial 7.187 (*) 7.350 - 7.450   pCO2  arterial 59.0 (*) 35.0 - 45.0 mmHg   pO2, Arterial 85.0  80.0 - 100.0 mmHg   Bicarbonate 22.3  20.0 - 24.0 mEq/L   TCO2 24  0 - 100 mmol/L   O2 Saturation 93.0     Acid-base deficit 6.0 (*) 0.0 - 2.0 mmol/L   Patient temperature 37.3 C     Sample type ARTERIAL    MAGNESIUM     Status: None   Collection Time    07/14/13  8:00 PM      Result Value Range   Magnesium 1.7  1.5 - 2.5 mg/dL  CBC     Status: Abnormal   Collection Time    07/14/13  8:00 PM      Result Value Range   WBC 22.0 (*) 4.0 - 10.5 K/uL   RBC 3.74 (*) 4.22 - 5.81 MIL/uL   Hemoglobin 11.3 (*) 13.0 - 17.0 g/dL   HCT 33.0 (*) 39.0 - 52.0 %   MCV 88.2  78.0 - 100.0 fL   MCH 30.2  26.0 - 34.0 pg   MCHC 34.2  30.0 - 36.0 g/dL   RDW 13.6  11.5 - 15.5 %   Platelets 174  150 - 400 K/uL  CREATININE, SERUM     Status: Abnormal   Collection Time    07/14/13  8:00 PM      Result Value Range   Creatinine, Ser 1.90 (*) 0.50 - 1.35 mg/dL   GFR calc non Af Amer 40 (*) >90 mL/min   GFR calc Af Amer 47 (*) >90 mL/min   Comment: (NOTE)     The eGFR has been calculated using the CKD EPI equation.     This calculation has not been validated in all clinical situations.     eGFR's persistently <90 mL/min signify possible Chronic Kidney     Disease.  POTASSIUM     Status: Abnormal   Collection Time    07/14/13  8:00 PM      Result Value Range   Potassium >7.5 (*) 3.5 - 5.1 mEq/L   Comment: NO VISIBLE HEMOLYSIS     REPEATED TO VERIFY     CRITICAL RESULT CALLED TO, READ BACK BY AND VERIFIED WITH:     JENNIFER PARRISH,RN AT 0911 07/14/13 BY ZPERRY.  GLUCOSE, CAPILLARY     Status: Abnormal   Collection Time    07/14/13  8:18 PM      Result Value Range   Glucose-Capillary 133 (*) 70 - 99 mg/dL  POCT I-STAT, CHEM 8     Status: Abnormal   Collection Time    07/14/13  8:18 PM      Result Value Range   Sodium 139  135 - 145 mEq/L   Potassium 7.5 (*) 3.5 - 5.1 mEq/L   Chloride 110  96 - 112 mEq/L   BUN 29 (*) 6 - 23 mg/dL    Creatinine, Ser 1.80 (*) 0.50 - 1.35 mg/dL   Glucose, Bld 147 (*) 70 - 99 mg/dL   Calcium, Ion 1.11 (*) 1.12 - 1.23 mmol/L   TCO2 23  0 - 100 mmol/L   Hemoglobin 11.2 (*) 13.0 - 17.0 g/dL   HCT 33.0 (*) 39.0 - 52.0 %   Comment VALUES  VERIFIED BY LAB OR ISTAT    GLUCOSE, CAPILLARY     Status: Abnormal   Collection Time    07/14/13  9:38 PM      Result Value Range   Glucose-Capillary 152 (*) 70 - 99 mg/dL  GLUCOSE, CAPILLARY     Status: Abnormal   Collection Time    07/14/13 10:43 PM      Result Value Range   Glucose-Capillary 137 (*) 70 - 99 mg/dL   Comment 1 Documented in Chart     Comment 2 Notify RN    POCT I-STAT, CHEM 8     Status: Abnormal   Collection Time    07/14/13 10:50 PM      Result Value Range   Sodium 142  135 - 145 mEq/L   Potassium 5.7 (*) 3.5 - 5.1 mEq/L   Chloride 109  96 - 112 mEq/L   BUN 27 (*) 6 - 23 mg/dL   Creatinine, Ser 2.10 (*) 0.50 - 1.35 mg/dL   Glucose, Bld 149 (*) 70 - 99 mg/dL   Calcium, Ion 1.12  1.12 - 1.23 mmol/L   TCO2 22  0 - 100 mmol/L   Hemoglobin 10.2 (*) 13.0 - 17.0 g/dL   HCT 30.0 (*) 39.0 - 52.0 %  POCT I-STAT 3, BLOOD GAS (G3+)     Status: Abnormal   Collection Time    07/14/13 10:51 PM      Result Value Range   pH, Arterial 7.287 (*) 7.350 - 7.450   pCO2 arterial 47.1 (*) 35.0 - 45.0 mmHg   pO2, Arterial 103.0 (*) 80.0 - 100.0 mmHg   Bicarbonate 22.2  20.0 - 24.0 mEq/L   TCO2 24  0 - 100 mmol/L   O2 Saturation 97.0     Acid-base deficit 4.0 (*) 0.0 - 2.0 mmol/L   Patient temperature 38.0 C     Collection site ARTERIAL LINE     Drawn by Operator     Sample type ARTERIAL    POCT I-STAT 3, BLOOD GAS (G3+)     Status: Abnormal   Collection Time    07/14/13 11:21 PM      Result Value Range   pH, Arterial 7.322 (*) 7.350 - 7.450   pCO2 arterial 45.8 (*) 35.0 - 45.0 mmHg   pO2, Arterial 90.0  80.0 - 100.0 mmHg   Bicarbonate 23.5  20.0 - 24.0 mEq/L   TCO2 25  0 - 100 mmol/L   O2 Saturation 96.0     Acid-base deficit 2.0   0.0 - 2.0 mmol/L   Patient temperature 37.9 C     Collection site ARTERIAL LINE     Drawn by Operator     Sample type ARTERIAL    POCT I-STAT, CHEM 8     Status: Abnormal   Collection Time    07/14/13 11:21 PM      Result Value Range   Sodium 142  135 - 145 mEq/L   Potassium 5.9 (*) 3.5 - 5.1 mEq/L   Chloride 109  96 - 112 mEq/L   BUN 31 (*) 6 - 23 mg/dL   Creatinine, Ser 2.10 (*) 0.50 - 1.35 mg/dL   Glucose, Bld 141 (*) 70 - 99 mg/dL   Calcium, Ion 1.12  1.12 - 1.23 mmol/L   TCO2 23  0 - 100 mmol/L   Hemoglobin 10.2 (*) 13.0 - 17.0 g/dL   HCT 30.0 (*) 39.0 - 52.0 %  POTASSIUM     Status: Abnormal  Collection Time    07/14/13 11:24 PM      Result Value Range   Potassium 5.9 (*) 3.5 - 5.1 mEq/L  GLUCOSE, CAPILLARY     Status: Abnormal   Collection Time    07/15/13 12:00 AM      Result Value Range   Glucose-Capillary 118 (*) 70 - 99 mg/dL  GLUCOSE, CAPILLARY     Status: Abnormal   Collection Time    07/15/13  1:09 AM      Result Value Range   Glucose-Capillary 108 (*) 70 - 99 mg/dL  POTASSIUM     Status: Abnormal   Collection Time    07/15/13  1:10 AM      Result Value Range   Potassium 6.0 (*) 3.5 - 5.1 mEq/L  CBC     Status: Abnormal   Collection Time    07/15/13  1:15 AM      Result Value Range   WBC 15.2 (*) 4.0 - 10.5 K/uL   RBC 3.43 (*) 4.22 - 5.81 MIL/uL   Hemoglobin 10.3 (*) 13.0 - 17.0 g/dL   HCT 30.9 (*) 39.0 - 52.0 %   MCV 90.1  78.0 - 100.0 fL   MCH 30.0  26.0 - 34.0 pg   MCHC 33.3  30.0 - 36.0 g/dL   RDW 13.7  11.5 - 15.5 %   Platelets 152  150 - 400 K/uL  BASIC METABOLIC PANEL     Status: Abnormal   Collection Time    07/15/13  1:15 AM      Result Value Range   Sodium 140  135 - 145 mEq/L   Potassium 6.2 (*) 3.5 - 5.1 mEq/L   Chloride 109  96 - 112 mEq/L   CO2 22  19 - 32 mEq/L   Glucose, Bld 128 (*) 70 - 99 mg/dL   BUN 29 (*) 6 - 23 mg/dL   Creatinine, Ser 1.96 (*) 0.50 - 1.35 mg/dL   Calcium 7.8 (*) 8.4 - 10.5 mg/dL   GFR calc non Af Amer  39 (*) >90 mL/min   GFR calc Af Amer 45 (*) >90 mL/min   Comment: (NOTE)     The eGFR has been calculated using the CKD EPI equation.     This calculation has not been validated in all clinical situations.     eGFR's persistently <90 mL/min signify possible Chronic Kidney     Disease.  MAGNESIUM     Status: None   Collection Time    07/15/13  1:15 AM      Result Value Range   Magnesium 1.8  1.5 - 2.5 mg/dL  GLUCOSE, CAPILLARY     Status: Abnormal   Collection Time    07/15/13  2:12 AM      Result Value Range   Glucose-Capillary 124 (*) 70 - 99 mg/dL  GLUCOSE, CAPILLARY     Status: Abnormal   Collection Time    07/15/13  3:28 AM      Result Value Range   Glucose-Capillary 131 (*) 70 - 99 mg/dL  POCT I-STAT 3, BLOOD GAS (G3+)     Status: Abnormal   Collection Time    07/15/13  4:07 AM      Result Value Range   pH, Arterial 7.257 (*) 7.350 - 7.450   pCO2 arterial 53.7 (*) 35.0 - 45.0 mmHg   pO2, Arterial 86.0  80.0 - 100.0 mmHg   Bicarbonate 23.8  20.0 - 24.0 mEq/L   TCO2 25  0 - 100 mmol/L   O2 Saturation 94.0     Acid-base deficit 3.0 (*) 0.0 - 2.0 mmol/L   Patient temperature 37.4 C     Collection site ARTERIAL LINE     Drawn by Operator     Sample type ARTERIAL    GLUCOSE, CAPILLARY     Status: Abnormal   Collection Time    07/15/13  4:33 AM      Result Value Range   Glucose-Capillary 121 (*) 70 - 99 mg/dL  POTASSIUM     Status: Abnormal   Collection Time    07/15/13  6:30 AM      Result Value Range   Potassium 5.4 (*) 3.5 - 5.1 mEq/L  GLUCOSE, CAPILLARY     Status: Abnormal   Collection Time    07/15/13  6:46 AM      Result Value Range   Glucose-Capillary 112 (*) 70 - 99 mg/dL  GLUCOSE, CAPILLARY     Status: Abnormal   Collection Time    07/15/13  8:07 AM      Result Value Range   Glucose-Capillary 146 (*) 70 - 99 mg/dL  GLUCOSE, CAPILLARY     Status: Abnormal   Collection Time    07/15/13  9:13 AM      Result Value Range   Glucose-Capillary 116 (*) 70 -  99 mg/dL  MAGNESIUM     Status: None   Collection Time    07/15/13 10:24 AM      Result Value Range   Magnesium 1.9  1.5 - 2.5 mg/dL  BASIC METABOLIC PANEL     Status: Abnormal   Collection Time    07/15/13 10:24 AM      Result Value Range   Sodium 141  135 - 145 mEq/L   Potassium 4.9  3.5 - 5.1 mEq/L   Chloride 105  96 - 112 mEq/L   CO2 25  19 - 32 mEq/L   Glucose, Bld 121 (*) 70 - 99 mg/dL   BUN 34 (*) 6 - 23 mg/dL   Creatinine, Ser 2.27 (*) 0.50 - 1.35 mg/dL   Calcium 8.5  8.4 - 10.5 mg/dL   GFR calc non Af Amer 32 (*) >90 mL/min   GFR calc Af Amer 38 (*) >90 mL/min   Comment: (NOTE)     The eGFR has been calculated using the CKD EPI equation.     This calculation has not been validated in all clinical situations.     eGFR's persistently <90 mL/min signify possible Chronic Kidney     Disease.  POCT I-STAT 3, BLOOD GAS (G3+)     Status: Abnormal   Collection Time    07/15/13 10:25 AM      Result Value Range   pH, Arterial 7.311 (*) 7.350 - 7.450   pCO2 arterial 48.6 (*) 35.0 - 45.0 mmHg   pO2, Arterial 64.0 (*) 80.0 - 100.0 mmHg   Bicarbonate 24.5 (*) 20.0 - 24.0 mEq/L   TCO2 26  0 - 100 mmol/L   O2 Saturation 89.0     Acid-base deficit 2.0  0.0 - 2.0 mmol/L   Patient temperature 37.3 C     Collection site BRACHIAL ARTERY     Drawn by RT     Sample type ARTERIAL    POCT I-STAT 4, (NA,K, GLUC, HGB,HCT)     Status: Abnormal   Collection Time    07/15/13 10:29 AM      Result Value Range  Sodium 145  135 - 145 mEq/L   Potassium 4.6  3.5 - 5.1 mEq/L   Glucose, Bld 117 (*) 70 - 99 mg/dL   HCT 31.0 (*) 39.0 - 52.0 %   Hemoglobin 10.5 (*) 13.0 - 17.0 g/dL  GLUCOSE, CAPILLARY     Status: Abnormal   Collection Time    07/15/13 12:06 PM      Result Value Range   Glucose-Capillary 117 (*) 70 - 99 mg/dL  POCT I-STAT 3, BLOOD GAS (G3+)     Status: Abnormal   Collection Time    07/15/13 12:07 PM      Result Value Range   pH, Arterial 7.287 (*) 7.350 - 7.450   pCO2  arterial 54.3 (*) 35.0 - 45.0 mmHg   pO2, Arterial 76.0 (*) 80.0 - 100.0 mmHg   Bicarbonate 25.9 (*) 20.0 - 24.0 mEq/L   TCO2 27  0 - 100 mmol/L   O2 Saturation 93.0     Acid-base deficit 1.0  0.0 - 2.0 mmol/L   Patient temperature 99.1 F     Collection site BRACHIAL ARTERY     Drawn by RT     Sample type ARTERIAL    GLUCOSE, CAPILLARY     Status: Abnormal   Collection Time    07/15/13  1:15 PM      Result Value Range   Glucose-Capillary 117 (*) 70 - 99 mg/dL    Dg Chest Portable 1 View In Am  07/15/2013   *RADIOLOGY REPORT*  Clinical Data: Postop  PORTABLE CHEST - 1 VIEW  Comparison: 07/14/2013  Findings: Support devices in unchanged position.  Widened cardiac silhouette stable.  Stable mild vascular congestion.  Mild interstitial edema.  Stable bibasilar atelectasis. Mild bilateral costophrenic angle blunting suggesting small effusions.  IMPRESSION: Mild postoperative vascular congestion/pulmonary edema.   Original Report Authenticated By: Skipper Cliche, M.D.   Dg Chest Portable 1 View  07/14/2013   *RADIOLOGY REPORT*  Clinical Data: Postop  PORTABLE CHEST - 1 VIEW  Comparison: 07/12/2013  Findings: Postop median sternotomy and CABG.  Endotracheal tube in good position.  Left chest tube in place.  NG tube in the stomach. Mediastinal drain good position.  Swan-Ganz catheter in the right pulmonary artery.  Negative for pneumothorax.  Negative for heart failure or edema. Mild left lower lobe atelectasis. Right paratracheal density may be due to   engorged SVC.  IMPRESSION: No acute complication following open heart surgery.   Original Report Authenticated By: Carl Best, M.D.    ROS Per HPI, unable to discuss complete ROS given his fatigue while interacting.    Blood pressure 120/75, pulse 90, temperature 99.1 F (37.3 C), temperature source Oral, resp. rate 32, height 5\' 9"  (1.753 m), weight 234 lb 9.1 oz (106.4 kg), SpO2 96.00%.  Gen: Alert, tired but interactive. NAD HEENT: NCAT,  EOMI, PERRL, NG in place CV: RRR, good S1/S2 Resp: CTABL, no wheezes, non-labored Abd: Tense, + BS, distended, large clean, dry intact bandage in place on Mid chest extending down to mid epigastric area, with 2 chest tubes in place. GU: Foley catheter in place Ext: 1-2 + edema, R leg with clean dry intact bandage on medial upper thigh, 2+ DP pulses, brisk cap refill.   L arm with clean bandage in place.  Neuro: Alert and oriented to person, place, and month, strength 5/5 and sensation intact in all 4 extremities.  Skin: no apparent rash   Assessment/Plan:  1. CAD with 3 vessel disease s/p CABG  day 1 - Hemodynamically stable and extubated maintaining sats on [place] - Management per primary team  2. Hyperkalemia post-op, AKI on CKD3 - hyperkalemia resolving. Controlled with Insulin drip and kayexalate enema - Cre acutely worsened to 2.27 from 1.80 so likely some contribution of AKI  - AKI possibly from hypotensive ATN  - collect UA - Other differential to consider if this proves to be persistent includes, transcellular shifts from acidemia or BB, or decreased aldo/aldo effects.  - Collect U K and Osm and Plasma K and Osm to calculate trans-tubular potassium gradient, also collect Urine studies for FeNa. - QRS 126, PR WNL and no peaked t waves on this am's EKG - consider hemolysis, although unlikely given multiple tests, however many were drawn off of the same arterial line - follow BMP -   3. CKD stage 3 - Cre 1.80 pre-op, relatively unchanged after CABG - Avoid nephrotoxins  4. Hypertension: Pressors weaned easily, now on Metoprolol and Imdur  5. Anemia - liely of acute blood loss vs dilutional, monitor with CBC  6. Metabolic Bone Disease: Consider checking PTH with CKD3.    Laroy Apple, MD [facility] Resident, PGY-2 07/15/2013, 2:12 PM  Patient seen and examined.  Agree with assessment and plan as above.  Pt with CKD 3 admitted for CABG. Post op K very high and  was treated with some improvement.  Repeat was 6 today. Pt received succinylcholine in the OR and has underlying CKD which was the likely cause of his hyperkalemia.  Cardiac arrest has been reported in similar situations with succinylcholine. Pt has stage III CKD with est GFR or 40 mL/min limiting his ability to excrete the K generated by the intra >extracellular shift causes by succinylcholine.  Would not use this medication in this pt again.  K+ is now normal, recheck in am.  Will follow.     Kelly Splinter  MD Pager [phone]    Cell  [phone] 07/15/2013, 3:04 PM

## 2013-07-15 NOTE — Progress Notes (Signed)
Patient ID: Michael Levy, male   DOB: [birth year], 47 y.o.   MRN: [id number] TCTS DAILY ICU PROGRESS NOTE                   [address]            [place] [address]          [phone]   1 Day Post-Op Procedure(s) (LRB): CORONARY ARTERY BYPASS GRAFTING (CABG) (N/A) RADIAL ARTERY HARVEST (Left)  Total Length of Stay:  LOS: 1 day   Subjective: Still on vent, marked elevated K during night up to 7.5 Awake and follows commands  On vent   Objective: Vital signs in last 24 hours: Temp:  [97.3 F (36.3 C)-100.6 F (38.1 C)] 99 F (37.2 C) (09/13 0819) Pulse Rate:  [88-93] 90 (09/13 0819) Cardiac Rhythm:  [-] Atrial paced (09/13 0800) Resp:  [0-38] 12 (09/13 0819) BP: (92-160)/(61-79) 120/75 mmHg (09/13 0819) SpO2:  [95 %-100 %] 96 % (09/13 0819) Arterial Line BP: (61-142)/(42-139) 142/139 mmHg (09/13 0715) FiO2 (%):  [40 %-50 %] 40 % (09/13 0943) Weight:  [234 lb 9.1 oz (106.4 kg)] 234 lb 9.1 oz (106.4 kg) (09/13 0500)  Filed Weights   07/13/13 1300 07/15/13 0500  Weight: 217 lb 8 oz (98.657 kg) 234 lb 9.1 oz (106.4 kg)    Weight change: 17 lb 1.1 oz (7.743 kg)   Hemodynamic parameters for last 24 hours: PAP: (26-49)/(15-35) 32/20 mmHg CO:  [4.8 L/min-6.9 L/min] 6.5 L/min CI:  [2.2 L/min/m2-3.2 L/min/m2] 3.2 L/min/m2  Intake/Output from previous day: 09/12 0701 - 09/13 0700 In: 4996.4 [I.V.:3612.4; Blood:434; NG/GT:150; IV [id number] Out: [id number] [Urine:2965; Emesis/NG output:300; Blood:1550; Chest Tube:188]  Intake/Output this shift: Total I/O In: 27.7 [I.V.:27.7] Out: 550 [Urine:450; Emesis/NG output:50; Chest Tube:50]  Current Meds: Scheduled Meds: . acetaminophen  1,000 mg Oral Q6H   Or  . acetaminophen (TYLENOL) oral liquid 160 mg/5 mL  1,000 mg Per Tube Q6H  . aspirin EC  325 mg Oral Daily   Or  . aspirin  324 mg Per Tube Daily  . atorvastatin  40 mg Oral Daily  . bisacodyl  10 mg Oral Daily   Or  . bisacodyl  10 mg Rectal Daily  .  cefUROXime (ZINACEF)  IV  1.5 g Intravenous Q12H  . docusate sodium  200 mg Oral Daily  . insulin regular  0-10 Units Intravenous TID WC  . metoprolol tartrate  12.5 mg Oral BID   Or  . metoprolol tartrate  12.5 mg Per Tube BID  . [START ON 07/16/2013] pantoprazole  40 mg Oral Daily  . sodium chloride  3 mL Intravenous Q12H   Continuous Infusions: . sodium chloride 20 mL/hr (07/15/13 0800)  . sodium chloride 20 mL/hr (07/15/13 0800)  . sodium chloride    . sodium chloride 20 mL/hr at 07/14/13 2315  . dexmedetomidine Stopped (07/15/13 0900)  . dextrose 5 % and 0.45% NaCl    . insulin (NOVOLIN-R) infusion 0.5 mL/hr at 07/15/13 0700  . lactated ringers 20 mL/hr at 07/14/13 1800  . nitroGLYCERIN 7 mcg/min (07/15/13 0800)  . phenylephrine (NEO-SYNEPHRINE) Adult infusion Stopped (07/14/13 2200)   PRN Meds:.albumin human, dextrose, metoprolol, midazolam, morphine injection, ondansetron (ZOFRAN) IV, oxyCODONE, sodium chloride  General appearance: alert, cooperative and mild distress Neurologic: intact Heart: regular rate and rhythm, S1, S2 normal, no murmur, click, rub or gallop Lungs: diminished breath sounds bibasilar Abdomen: soft, non-tender; bowel sounds normal; no masses,  no organomegaly  Extremities: extremities normal, atraumatic, no cyanosis or edema and Homans sign is negative, no sign of DVT Wound: sternum stable, dressing intact  Lab Results: CBC: Recent Labs  07/14/13 2000  07/14/13 2321 07/15/13 0115  WBC 22.0*  --   --  15.2*  HGB 11.3*  < > 10.2* 10.3*  HCT 33.0*  < > 30.0* 30.9*  PLT 174  --   --  152  < > = values in this interval not displayed. BMET:  Recent Labs  07/12/13 1356  07/14/13 2321  07/15/13 0115 07/15/13 0630  NA 137  < > 142  --  140  --   K 4.6  < > 5.9*  < > 6.2* 5.4*  CL 105  < > 109  --  109  --   CO2 21  --   --   --  22  --   GLUCOSE 87  < > 141*  --  128*  --   BUN 28*  < > 31*  --  29*  --   CREATININE 1.80*  < > 2.10*  --  1.96*   --   CALCIUM 9.4  --   --   --  7.8*  --   < > = values in this interval not displayed.  PT/INR:  Recent Labs  07/14/13 1400  LABPROT 16.4*  INR 1.36   Radiology: Dg Chest Portable 1 View In Am  07/15/2013   *RADIOLOGY REPORT*  Clinical Data: Postop  PORTABLE CHEST - 1 VIEW  Comparison: 07/14/2013  Findings: Support devices in unchanged position.  Widened cardiac silhouette stable.  Stable mild vascular congestion.  Mild interstitial edema.  Stable bibasilar atelectasis. Mild bilateral costophrenic angle blunting suggesting small effusions.  IMPRESSION: Mild postoperative vascular congestion/pulmonary edema.   Original Report Authenticated By: Skipper Cliche, M.D.   Dg Chest Portable 1 View  07/14/2013   *RADIOLOGY REPORT*  Clinical Data: Postop  PORTABLE CHEST - 1 VIEW  Comparison: 07/12/2013  Findings: Postop median sternotomy and CABG.  Endotracheal tube in good position.  Left chest tube in place.  NG tube in the stomach. Mediastinal drain good position.  Swan-Ganz catheter in the right pulmonary artery.  Negative for pneumothorax.  Negative for heart failure or edema. Mild left lower lobe atelectasis. Right paratracheal density may be due to   engorged SVC.  IMPRESSION: No acute complication following open heart surgery.   Original Report Authenticated By: Carl Best, M.D.   CREATININE: 1.96 mg/dL ABNORMAL (07/15/13 0115) Estimated creatinine clearance - Cockcroft-Gault CrCl: 55.4 mL/min    Assessment/Plan: S/P Procedure(s) (LRB): CORONARY ARTERY BYPASS GRAFTING (CABG) (N/A) RADIAL ARTERY HARVEST (Left) Mobilize Diuresis Diabetes control Continue foley due to acute urinary retention, patient in ICU and urinary output monitoring See progression orders Wean vent as tolerated Hyperkalemia on multiple blood draws,now 5.4 have asked renal to see Chronic Kidney Disease  preop CR  1.8 GFR 43, now 1.96 GFR 39   Stage I     GFR >90  Stage II    GFR 60-89  Stage IIIA GFR 45-59  Stage  IIIB GFR 30-44  Stage IV   GFR 15-29  Stage V    GFR  <15    Holliday Sheaffer B 07/15/2013 10:02 AM

## 2013-07-15 NOTE — Progress Notes (Signed)
CRITICAL VALUE ALERT  Critical value received:  K+ >7.5  Date of notification:  07/14/13  Time of notification:  2111  Critical value read back:yes  Nurse who received alert:  Vivia Ewing, RN  Responding MD:  Dr. Servando Snare  Time MD responded:  2115

## 2013-07-15 NOTE — Progress Notes (Signed)
K+=6.2 on AM labs.  Dr. Servando Snare called to make aware.  Received orders for 1 amp Bicarb, 40 mg IV lasix and repeat K+ in one hour.  Will implement orders and continue to monitor pt.

## 2013-07-15 NOTE — Progress Notes (Signed)
RN notified Dr. Servando Snare of 12:07 ABG results.  Per RN, no new RT orders from MD.

## 2013-07-16 ENCOUNTER — Inpatient Hospital Stay (HOSPITAL_COMMUNITY): Payer: BC Managed Care – PPO

## 2013-07-16 LAB — CBC
HCT: 29.1 % — ABNORMAL LOW (ref 39.0–52.0)
HCT: 27.8 % — ABNORMAL LOW (ref 39.0–52.0)
Hemoglobin: 9.5 g/dL — ABNORMAL LOW (ref 13.0–17.0)
Hemoglobin: 9.1 g/dL — ABNORMAL LOW (ref 13.0–17.0)
MCH: 30.1 pg (ref 26.0–34.0)
MCH: 29.9 pg (ref 26.0–34.0)
MCHC: 32.6 g/dL (ref 30.0–36.0)
MCHC: 32.7 g/dL (ref 30.0–36.0)
MCV: 92.1 fL (ref 78.0–100.0)
MCV: 91.4 fL (ref 78.0–100.0)
Platelets: 128 10*3/uL — ABNORMAL LOW (ref 150–400)
Platelets: 126 10*3/uL — ABNORMAL LOW (ref 150–400)
RBC: 3.16 MIL/uL — ABNORMAL LOW (ref 4.22–5.81)
RBC: 3.04 MIL/uL — ABNORMAL LOW (ref 4.22–5.81)
RDW: 14.2 % (ref 11.5–15.5)
RDW: 14 % (ref 11.5–15.5)
WBC: 25 10*3/uL — ABNORMAL HIGH (ref 4.0–10.5)
WBC: 19.9 10*3/uL — ABNORMAL HIGH (ref 4.0–10.5)

## 2013-07-16 LAB — BASIC METABOLIC PANEL
BUN: 61 mg/dL — ABNORMAL HIGH (ref 6–23)
CO2: 24 mEq/L (ref 19–32)
Calcium: 8.8 mg/dL (ref 8.4–10.5)
Chloride: 102 mEq/L (ref 96–112)
Creatinine, Ser: 3.35 mg/dL — ABNORMAL HIGH (ref 0.50–1.35)
GFR calc Af Amer: 23 mL/min — ABNORMAL LOW (ref >90)
GFR calc non Af Amer: 20 mL/min — ABNORMAL LOW (ref >90)
Glucose, Bld: 109 mg/dL — ABNORMAL HIGH (ref 70–99)
Potassium: 4.8 mEq/L (ref 3.5–5.1)
Sodium: 141 mEq/L (ref 135–145)

## 2013-07-16 LAB — COMPREHENSIVE METABOLIC PANEL
ALT: 47 U/L (ref 0–53)
AST: 74 U/L — ABNORMAL HIGH (ref 0–37)
Albumin: 2.9 g/dL — ABNORMAL LOW (ref 3.5–5.2)
Alkaline Phosphatase: 51 U/L (ref 39–117)
BUN: 48 mg/dL — ABNORMAL HIGH (ref 6–23)
CO2: 25 mEq/L (ref 19–32)
Calcium: 8.8 mg/dL (ref 8.4–10.5)
Chloride: 104 mEq/L (ref 96–112)
Creatinine, Ser: 3.12 mg/dL — ABNORMAL HIGH (ref 0.50–1.35)
GFR calc Af Amer: 26 mL/min — ABNORMAL LOW (ref >90)
GFR calc non Af Amer: 22 mL/min — ABNORMAL LOW (ref >90)
Glucose, Bld: 110 mg/dL — ABNORMAL HIGH (ref 70–99)
Potassium: 5.3 mEq/L — ABNORMAL HIGH (ref 3.5–5.1)
Sodium: 141 mEq/L (ref 135–145)
Total Bilirubin: 0.4 mg/dL (ref 0.3–1.2)
Total Protein: 6.2 g/dL (ref 6.0–8.3)

## 2013-07-16 LAB — GLUCOSE, CAPILLARY
Glucose-Capillary: 105 mg/dL — ABNORMAL HIGH (ref 70–99)
Glucose-Capillary: 97 mg/dL (ref 70–99)

## 2013-07-16 MED ORDER — SODIUM POLYSTYRENE SULFONATE 15 GM/60ML PO SUSP: 30.0000 g | Freq: Once | ORAL | Status: DC

## 2013-07-16 MED ORDER — DOPAMINE-DEXTROSE 3.2-5 MG/ML-% IV SOLN
3.0000 ug/kg/min | INTRAVENOUS | Status: AC
  Administered 2013-07-16: 3 ug/kg/min via INTRAVENOUS

## 2013-07-16 MED ORDER — FUROSEMIDE 10 MG/ML IJ SOLN: 80.0000 mg | Freq: Once | INTRAMUSCULAR | Status: AC

## 2013-07-16 MED ORDER — FUROSEMIDE 10 MG/ML IJ SOLN
80.0000 mg | Freq: Three times a day (TID) | INTRAMUSCULAR | Status: DC
  Administered 2013-07-16 – 2013-07-18 (×6): 80 mg via INTRAVENOUS
  Filled 2013-07-16 (×10): qty 8

## 2013-07-16 MED ORDER — DOPAMINE-DEXTROSE 3.2-5 MG/ML-% IV SOLN
3.0000 ug/kg/min | INTRAVENOUS | Status: DC
  Administered 2013-07-17: 2 ug/kg/min via INTRAVENOUS
  Administered 2013-07-17: 1 ug/kg/min via INTRAVENOUS

## 2013-07-16 MED ORDER — FUROSEMIDE 10 MG/ML IJ SOLN
INTRAMUSCULAR | Status: AC
  Administered 2013-07-16: 80 mg
  Filled 2013-07-16: qty 8

## 2013-07-16 MED ORDER — LEVALBUTEROL HCL 0.63 MG/3ML IN NEBU
0.6300 mg | INHALATION_SOLUTION | Freq: Three times a day (TID) | RESPIRATORY_TRACT | Status: DC
  Administered 2013-07-16 – 2013-07-20 (×13): 0.63 mg via RESPIRATORY_TRACT
  Filled 2013-07-16 (×14): qty 3

## 2013-07-16 MED ORDER — DOPAMINE-DEXTROSE 3.2-5 MG/ML-% IV SOLN
INTRAVENOUS | Status: AC
  Administered 2013-07-16: 3 ug/kg/min via INTRAVENOUS
  Filled 2013-07-16: qty 250

## 2013-07-16 MED ORDER — ENOXAPARIN SODIUM 30 MG/0.3ML ~~LOC~~ SOLN
30.0000 mg | SUBCUTANEOUS | Status: DC
  Administered 2013-07-16 – 2013-07-20 (×5): 30 mg via SUBCUTANEOUS
  Filled 2013-07-16 (×7): qty 0.3

## 2013-07-16 MED ORDER — FUROSEMIDE 10 MG/ML IJ SOLN
INTRAMUSCULAR | Status: AC
  Filled 2013-07-16: qty 4

## 2013-07-16 MED ORDER — FUROSEMIDE 10 MG/ML IJ SOLN
40.0000 mg | Freq: Once | INTRAMUSCULAR | Status: AC
  Administered 2013-07-16: 40 mg via INTRAVENOUS

## 2013-07-16 NOTE — Progress Notes (Addendum)
Patient ID: Michael Levy, male   DOB: 04/16/1966, 47 y.o.   MRN: [id number] TCTS DAILY ICU PROGRESS NOTE                   [address]            ,[place] [address]          [phone]   2 Days Post-Op Procedure(s) (LRB): CORONARY ARTERY BYPASS GRAFTING (CABG) (N/A) RADIAL ARTERY HARVEST (Left)  Total Length of Stay:  LOS: 2 days   Subjective: Extubated, awake and alert neuro intact  Objective: Vital signs in last 24 hours: Temp:  [97.4 F (36.3 C)-99.1 F (37.3 C)] 97.4 F (36.3 C) (09/14 0800) Pulse Rate:  [87-112] 106 (09/14 0600) Cardiac Rhythm:  [-] Sinus tachycardia (09/14 0600) Resp:  [17-37] 32 (09/14 0600) BP: (98-153)/(61-81) 153/80 mmHg (09/14 0600) SpO2:  [89 %-98 %] 90 % (09/14 0600) FiO2 (%):  [40 %-50 %] 50 % (09/14 0600) Weight:  [225 lb 12 oz (102.4 kg)] 225 lb 12 oz (102.4 kg) (09/14 0643)  Filed Weights   07/13/13 1300 07/15/13 0500 07/16/13 0643  Weight: 217 lb 8 oz (98.657 kg) 234 lb 9.1 oz (106.4 kg) 225 lb 12 oz (102.4 kg)    Weight change: -8 lb 13.1 oz (-4 kg)   Hemodynamic parameters for last 24 hours:    Intake/Output from previous day: 09/13 0701 - 09/14 0700 In: 1301 [P.O.:90; I.V.:1111; IV Piggyback:100] Out: 1350 [Urine:1210; Emesis/NG output:50; Chest Tube:90]  Intake/Output this shift: Total I/O In: 91.2 [I.V.:91.2] Out: 105 [Urine:105]  Current Meds: Scheduled Meds: . acetaminophen  1,000 mg Oral Q6H   Or  . acetaminophen (TYLENOL) oral liquid 160 mg/5 mL  1,000 mg Per Tube Q6H  . aspirin EC  325 mg Oral Daily   Or  . aspirin  324 mg Per Tube Daily  . atorvastatin  40 mg Oral q1800  . bisacodyl  10 mg Oral Daily   Or  . bisacodyl  10 mg Rectal Daily  . cefUROXime (ZINACEF)  IV  1.5 g Intravenous Q12H  . docusate sodium  200 mg Oral Daily  . insulin aspart  0-24 Units Subcutaneous Q4H  . insulin detemir  10 Units Subcutaneous Daily  . insulin regular  0-10 Units Intravenous TID WC  . isosorbide  mononitrate  60 mg Oral Daily  . metoprolol tartrate  12.5 mg Oral BID   Or  . metoprolol tartrate  12.5 mg Per Tube BID  . pantoprazole  40 mg Oral Daily  . sodium chloride  3 mL Intravenous Q12H   Continuous Infusions: . sodium chloride 20 mL/hr (07/15/13 0800)  . sodium chloride 20 mL/hr (07/15/13 0800)  . sodium chloride    . sodium chloride 20 mL/hr at 07/14/13 2315  . dexmedetomidine Stopped (07/15/13 0900)  . dextrose 5 % and 0.45% NaCl    . insulin (NOVOLIN-R) infusion 0.5 mL/hr at 07/15/13 0700  . lactated ringers 20 mL/hr at 07/14/13 1800  . nitroGLYCERIN Stopped (07/15/13 1400)  . phenylephrine (NEO-SYNEPHRINE) Adult infusion Stopped (07/14/13 2200)   PRN Meds:.dextrose, metoprolol, midazolam, morphine injection, ondansetron (ZOFRAN) IV, oxyCODONE, sodium chloride  General appearance: alert, cooperative and mild distress Neurologic: intact Heart: regular rate and rhythm, S1, S2 normal, no murmur, click, rub or gallop Lungs: diminished breath sounds bibasilar Abdomen: soft, non-tender; bowel sounds present but hypoactive ; no masses,  no organomegaly Extremities: extremities normal, atraumatic, no cyanosis or edema and Homans sign  is negative, no sign of DVT Wound: sternum stable, dressing intact  Lab Results: CBC:  Recent Labs  07/15/13 1700 07/16/13 0343  WBC 20.2* 25.0*  HGB 9.9* 9.5*  HCT 30.5* 29.1*  PLT 155 128*   BMET:   Recent Labs  07/15/13 1700 07/16/13 0343  NA 142 141  K 5.5* 5.3*  CL 106 104  CO2 26 25  GLUCOSE 126* 110*  BUN 38* 48*  CREATININE 2.62* 3.12*  CALCIUM 8.5 8.8    PT/INR:   Recent Labs  07/14/13 1400  LABPROT 16.4*  INR 1.36   Radiology: US Renal  07/16/2013   CLINICAL DATA:  Chronic kidney disease stage II, hypertension,AKI  EXAM: RENAL/URINARY TRACT ULTRASOUND COMPLETE  COMPARISON:  None.  FINDINGS: Right Kidney: Right kidney measures 11.8 cm in length. No hydronephrosis or diagnostic renal calculus. A cyst in  lower pole of the right kidney measures 1.2 cm x 1 cm.  Left Kidney: Left kidney measures 11.3 cm in length. Hypoechoic cyst in upper pole measures 2.8 x 2.4 cm. There is a 2nd hypoechoic cyst in lower pole measures 3 x 1.9 cm. Twelfth hypoechoic cyst in lower pole measures 2.7 x 2 cm.  Bladder:  Urinary bladder is decompressed by Foley catheter.  IMPRESSION: 1. Bilateral renal cysts. No hydronephrosis or diagnostic renal calculus. Decompressed urinary bladder by Foley catheter.   Electronically Signed   By: Lahoma Crocker   On: 07/16/2013 08:58   Dg Chest Port 1 View  07/16/2013   CLINICAL DATA:  47 year old male status post chest tube removal.  EXAM: PORTABLE CHEST - 1 VIEW  COMPARISON:  07/15/2013 and earlier.  FINDINGS: Portable AP upright view at 0547 hrs. Extubated. Mediastinal and left chest tube removed. Enteric tube removed. Swan-Ganz catheter removed, right IJ introducer sheath remains.  No pneumothorax. Stable lung volumes. Stable cardiomegaly and mediastinal contours. Perihilar/infrahilar opacity rib most suggestive of atelectasis is stable to mildly increased. Probable small effusions. No pulmonary edema.  IMPRESSION: 1. Extubated, enteric, Swan-Ganz, and chest tubes removed. No pneumothorax.  2. Stable lung volumes. Mildly increased atelectasis. Small effusion suspected.   Electronically Signed   By: Lars Pinks M.D.   On: 07/16/2013 07:30      Assessment/Plan: S/P Procedure(s) (LRB): CORONARY ARTERY BYPASS GRAFTING (CABG) (N/A) RADIAL ARTERY HARVEST (Left) Mobilize Diuresis Diabetes control Continue foley due to acute urinary retention, patient in ICU and urinary output monitoring See progression orders Continue dopamine Hyperkalemia on multiple blood draws,now 5.4  Renal has seen  leucocytosis Acute Kidney Injury (any one)   Increase in SCr by > 0.3 within 48 hours  Increase SCr to > 1.5 times baseline  Urine volume < 0.5 ml/kg/h for 6 hrs  Stage:  Risk:   1.5x increase in  creatinine or GFR decrease by 25% or UOP <0.56ml/kgperhr for 6 hrs  Injury:  2x increase in creatinine or GFR decrease by 50% or UOP < 0.46ml/kgperhr for 12 hr  Failure:3X increase in creatinine or GFR decrease by 75% or UOP < 0.44ml/kgperhr for 12 hr or anuria 12               Loss: complete loss of kidney  function for more then 4 weeks  End-stage renal disease:Complete loss of kidney function for more then 3 months  Chronic Kidney Disease  preop CR  1.8 GFR 43, now 3.12   Stage I     GFR >90  Stage II    GFR 60-89  Stage IIIA GFR  45-59  Stage IIIB GFR 30-44  Stage IV   GFR 15-29  Stage V    GFR  <15    Nevaen Tredway B 07/16/2013 9:22 AM

## 2013-07-16 NOTE — Progress Notes (Signed)
Patient ID: Aquiles Abresch, male   DOB: Mar 25, 1966, 47 y.o.   MRN: [id number] EVENING ROUNDS NOTE :     [address]       [place] [address]             [phone]                 2 Days Post-Op Procedure(s) (LRB): CORONARY ARTERY BYPASS GRAFTING (CABG) (N/A) RADIAL ARTERY HARVEST (Left)  Total Length of Stay:  LOS: 2 days  BP 133/77  Pulse 94  Temp(Src) 97.2 F (36.2 C) (Axillary)  Resp 22  Ht 5\' 9"  (1.753 m)  Wt 225 lb 12 oz (102.4 kg)  BMI 33.32 kg/m2  SpO2 96%  .Intake/Output     09/14 0701 - 09/15 0700   P.O.    I.V. (mL/kg) 462.2 (4.5)   IV Piggyback    Total Intake(mL/kg) 462.2 (4.5)   Urine (mL/kg/hr) 840 (0.7)   Emesis/NG output    Chest Tube    Total Output 840   Net -377.8         . sodium chloride 20 mL/hr (07/15/13 0800)  . sodium chloride 20 mL/hr (07/15/13 0800)  . sodium chloride    . sodium chloride 20 mL/hr at 07/14/13 2315  . dextrose 5 % and 0.45% NaCl    . DOPamine 3 mcg/kg/min (07/16/13 1900)  . nitroGLYCERIN Stopped (07/15/13 1400)  . phenylephrine (NEO-SYNEPHRINE) Adult infusion Stopped (07/14/13 2200)     Lab Results  Component Value Date   WBC 19.9* 07/16/2013   HGB 9.1* 07/16/2013   HCT 27.8* 07/16/2013   PLT 126* 07/16/2013   GLUCOSE 109* 07/16/2013   ALT 47 07/16/2013   AST 74* 07/16/2013   NA 141 07/16/2013   K 4.8 07/16/2013   CL 102 07/16/2013   CREATININE 3.35* 07/16/2013   BUN 61* 07/16/2013   CO2 24 07/16/2013   INR 1.36 07/14/2013   HGBA1C 5.8* 07/12/2013   Up to chair , sob with activity On dopamine and lasix q8 hrs  Grace Isaac MD  Beeper [phone] Office [phone] 07/16/2013 7:35 PM

## 2013-07-16 NOTE — Progress Notes (Addendum)
[facility] Progress Note    Assessment/ Plan:   1. Acute on chronic kidney disease - Baseline cr around 1.8, 2.2 on 9/13. Possible acute increase due to renal hypoperfusion after CABG 9/12. Initially on pressors, currently stable off pressors.  - Today, creatinine 3.12, likely continued ATN from acute hypoperfusion 9/12. Continue to monitor 2. Hyperkalemia - Likely from renal hypoperfusion. Could also be secondary to LR, lovenox, beta-blockade. TTKG 10.84 making hypoaldo less likely acutely. FeNa 0.1%. UA with hyaline casts and small leukocytes. Also possible hemolysis from a-line specimens, succinylcholine, mild rhabdo; better today - For now, d/c LR give IV lasix for SOB, should help K as well - F/u labs tomorrow  Subjective:   No new complaints overnight other than mild abdominal pain with some loose stools. Denies chest pain or increased dyspnea.   Objective:   BP 153/80  Pulse 106  Temp(Src) 97.4 F (36.3 C) (Oral)  Resp 32  Ht 5\' 9"  (1.753 m)  Wt 225 lb 12 oz (102.4 kg)  BMI 33.32 kg/m2  SpO2 90%  Intake/Output Summary (Last 24 hours) at 07/16/13 0940 Last data filed at 07/16/13 0900  Gross per 24 hour  Intake 1296.1 ml  Output    905 ml  Net  391.1 ml   Weight change: -8 lb 13.1 oz (-4 kg)  Physical Exam: Gen: Lying in bed, oxygen mask in place, NAD, pleasant CVS:RRR, no m/r/g, distant heart sounds; dressing in place over sternum Resp:Normal effort with oxygen mask in place, CTAB anteriorly except some wheezing [id number], mild generalized tenderness, distended,  Ext:No lower extremity edema  Imaging:  CXR: Mildly increased atelectasis with possible small effusion  Renal US: 1. Bilateral renal cysts. No hydronephrosis or diagnostic renal  calculus. Decompressed urinary bladder by Foley catheter.  Labs: BMET  Recent Labs Lab 07/12/13 1356  07/14/13 2250 07/14/13 2321  07/15/13 0115 07/15/13 0630 07/15/13 1024 07/15/13 1029  07/15/13 1656 07/15/13 1700 07/16/13 0343  NA 137  < > 142 142  --  140  --  141 145 144 142 141  K 4.6  < > 5.7* 5.9*  < > 6.2* 5.4* 4.9 4.6 5.5* 5.5* 5.3*  CL 105  < > 109 109  --  109  --  105  --  107 106 104  CO2 21  --   --   --   --  22  --  25  --   --  26 25  GLUCOSE 87  < > 149* 141*  --  128*  --  121* 117* 126* 126* 110*  BUN 28*  < > 27* 31*  --  29*  --  34*  --  39* 38* 48*  CREATININE 1.80*  < > 2.10* 2.10*  --  1.96*  --  2.27*  --  2.50* 2.62* 3.12*  CALCIUM 9.4  --   --   --   --  7.8*  --  8.5  --   --  8.5 8.8  < > = values in this interval not displayed. CBC  Recent Labs Lab 07/14/13 2000  07/15/13 0115 07/15/13 1029 07/15/13 1656 07/15/13 1700 07/16/13 0343  WBC 22.0*  --  15.2*  --   --  20.2* 25.0*  HGB 11.3*  < > 10.3* 10.5* 9.9* 9.9* 9.5*  HCT 33.0*  < > 30.9* 31.0* 29.0* 30.5* 29.1*  MCV 88.2  --  90.1  --   --  92.1 92.1  PLT 174  --  152  --   --  155 128*  < > = values in this interval not displayed.  Medications:    . acetaminophen  1,000 mg Oral Q6H   Or  . acetaminophen (TYLENOL) oral liquid 160 mg/5 mL  1,000 mg Per Tube Q6H  . aspirin EC  325 mg Oral Daily   Or  . aspirin  324 mg Per Tube Daily  . atorvastatin  40 mg Oral q1800  . bisacodyl  10 mg Oral Daily   Or  . bisacodyl  10 mg Rectal Daily  . cefUROXime (ZINACEF)  IV  1.5 g Intravenous Q12H  . docusate sodium  200 mg Oral Daily  . enoxaparin (LOVENOX) injection  30 mg Subcutaneous Q24H  . furosemide  40 mg Intravenous Once  . insulin aspart  0-24 Units Subcutaneous Q4H  . insulin detemir  10 Units Subcutaneous Daily  . insulin regular  0-10 Units Intravenous TID WC  . isosorbide mononitrate  60 mg Oral Daily  . metoprolol tartrate  12.5 mg Oral BID   Or  . metoprolol tartrate  12.5 mg Per Tube BID  . pantoprazole  40 mg Oral Daily  . sodium chloride  3 mL Intravenous Q12H      Hilton Sinclair, MD  Family Practice PGY-2 07/16/2013, 9:40 AM   Patient seen and  examined.  I agree with assessment and plan as above with additions as indicated. Kelly Splinter  MD Pager [phone]    Cell  [phone] 07/16/2013, 10:47 AM

## 2013-07-17 LAB — CBC
HCT: 25.6 % — ABNORMAL LOW (ref 39.0–52.0)
Hemoglobin: 8.6 g/dL — ABNORMAL LOW (ref 13.0–17.0)
MCH: 30.2 pg (ref 26.0–34.0)
MCHC: 33.6 g/dL (ref 30.0–36.0)
MCV: 89.8 fL (ref 78.0–100.0)
Platelets: 125 10*3/uL — ABNORMAL LOW (ref 150–400)
RBC: 2.85 MIL/uL — ABNORMAL LOW (ref 4.22–5.81)
RDW: 14 % (ref 11.5–15.5)
WBC: 15.5 10*3/uL — ABNORMAL HIGH (ref 4.0–10.5)

## 2013-07-17 LAB — COMPREHENSIVE METABOLIC PANEL
ALT: 38 U/L (ref 0–53)
AST: 42 U/L — ABNORMAL HIGH (ref 0–37)
Albumin: 2.5 g/dL — ABNORMAL LOW (ref 3.5–5.2)
Alkaline Phosphatase: 67 U/L (ref 39–117)
BUN: 68 mg/dL — ABNORMAL HIGH (ref 6–23)
CO2: 25 mEq/L (ref 19–32)
Calcium: 8.5 mg/dL (ref 8.4–10.5)
Chloride: 101 mEq/L (ref 96–112)
Creatinine, Ser: 3.12 mg/dL — ABNORMAL HIGH (ref 0.50–1.35)
GFR calc Af Amer: 26 mL/min — ABNORMAL LOW (ref >90)
GFR calc non Af Amer: 22 mL/min — ABNORMAL LOW (ref >90)
Glucose, Bld: 118 mg/dL — ABNORMAL HIGH (ref 70–99)
Potassium: 4.6 mEq/L (ref 3.5–5.1)
Sodium: 139 mEq/L (ref 135–145)
Total Bilirubin: 0.4 mg/dL (ref 0.3–1.2)
Total Protein: 6.7 g/dL (ref 6.0–8.3)

## 2013-07-17 LAB — GLUCOSE, CAPILLARY
Glucose-Capillary: 117 mg/dL — ABNORMAL HIGH (ref 70–99)
Glucose-Capillary: 86 mg/dL (ref 70–99)

## 2013-07-17 LAB — BASIC METABOLIC PANEL
BUN: 72 mg/dL — ABNORMAL HIGH (ref 6–23)
CO2: 25 mEq/L (ref 19–32)
Chloride: 99 mEq/L (ref 96–112)
Creatinine, Ser: 2.88 mg/dL — ABNORMAL HIGH (ref 0.50–1.35)

## 2013-07-17 LAB — MAGNESIUM: Magnesium: 1.7 mg/dL (ref 1.5–2.5)

## 2013-07-17 MED ORDER — INSULIN ASPART 100 UNIT/ML ~~LOC~~ SOLN
0.0000 [IU] | Freq: Three times a day (TID) | SUBCUTANEOUS | Status: DC
  Administered 2013-07-17: 2 [IU] via SUBCUTANEOUS
  Administered 2013-07-19: 3 [IU] via SUBCUTANEOUS
  Administered 2013-07-20: 2 [IU] via SUBCUTANEOUS

## 2013-07-17 MED ORDER — AMIODARONE HCL IN DEXTROSE 360-4.14 MG/200ML-% IV SOLN
60.0000 mg/h | INTRAVENOUS | Status: AC
  Administered 2013-07-17: 60 mg/h via INTRAVENOUS
  Filled 2013-07-17: qty 200

## 2013-07-17 MED ORDER — NEPRO/CARBSTEADY PO LIQD
237.0000 mL | Freq: Two times a day (BID) | ORAL | Status: DC
  Administered 2013-07-17 (×2): 237 mL via ORAL
  Filled 2013-07-17 (×18): qty 237

## 2013-07-17 MED ORDER — AMIODARONE HCL IN DEXTROSE 360-4.14 MG/200ML-% IV SOLN
INTRAVENOUS | Status: AC
  Administered 2013-07-17: 60 mg/h via INTRAVENOUS
  Filled 2013-07-17: qty 200

## 2013-07-17 MED ORDER — AMIODARONE LOAD VIA INFUSION
150.0000 mg | Freq: Once | INTRAVENOUS | Status: AC
  Administered 2013-07-17: 150 mg via INTRAVENOUS
  Filled 2013-07-17: qty 83.34

## 2013-07-17 MED ORDER — METOCLOPRAMIDE HCL 5 MG/ML IJ SOLN
10.0000 mg | Freq: Four times a day (QID) | INTRAMUSCULAR | Status: AC
  Administered 2013-07-17 – 2013-07-18 (×4): 10 mg via INTRAVENOUS
  Filled 2013-07-17 (×4): qty 2

## 2013-07-17 MED ORDER — AMIODARONE HCL IN DEXTROSE 360-4.14 MG/200ML-% IV SOLN
30.0000 mg/h | INTRAVENOUS | Status: AC
  Administered 2013-07-18 – 2013-07-21 (×6): 30 mg/h via INTRAVENOUS
  Filled 2013-07-17 (×14): qty 200

## 2013-07-17 MED ORDER — LACTULOSE 10 GM/15ML PO SOLN
30.0000 g | Freq: Every day | ORAL | Status: AC
  Administered 2013-07-17: 30 g via ORAL
  Filled 2013-07-17: qty 45

## 2013-07-17 MED FILL — Sodium Bicarbonate IV Soln 8.4%: INTRAVENOUS | Qty: 100 | Status: AC

## 2013-07-17 MED FILL — Lidocaine HCl IV Inj 20 MG/ML: INTRAVENOUS | Qty: 5 | Status: AC

## 2013-07-17 MED FILL — Heparin Sodium (Porcine) Inj 1000 Unit/ML: INTRAMUSCULAR | Qty: 10 | Status: AC

## 2013-07-17 MED FILL — Sodium Chloride Irrigation Soln 0.9%: Qty: 3000 | Status: AC

## 2013-07-17 MED FILL — Magnesium Sulfate Inj 50%: INTRAMUSCULAR | Qty: 10 | Status: AC

## 2013-07-17 MED FILL — Potassium Chloride Inj 2 mEq/ML: INTRAVENOUS | Qty: 40 | Status: AC

## 2013-07-17 MED FILL — Dexmedetomidine HCl IV Soln 200 MCG/2ML: INTRAVENOUS | Qty: 2 | Status: AC

## 2013-07-17 MED FILL — Electrolyte-R (PH 7.4) Solution: INTRAVENOUS | Qty: 3000 | Status: AC

## 2013-07-17 MED FILL — Mannitol IV Soln 20%: INTRAVENOUS | Qty: 500 | Status: AC

## 2013-07-17 MED FILL — Sodium Chloride IV Soln 0.9%: INTRAVENOUS | Qty: 1000 | Status: AC

## 2013-07-17 MED FILL — Heparin Sodium (Porcine) Inj 1000 Unit/ML: INTRAMUSCULAR | Qty: 30 | Status: AC

## 2013-07-17 NOTE — Progress Notes (Signed)
Dr. Lucianne Lei Trigt rounded on patient and was notified of K+=3.8 and decreasing creatinine.

## 2013-07-17 NOTE — Progress Notes (Signed)
T. CTS p.m. Rounds  Patient breathing currently on 40% mask Sinus tachycardia blood pressure stable Creatinine improving excellent diuresis after IV Lasix 80 mg Abdomen soft but no BM--lactulose ordered

## 2013-07-17 NOTE — Progress Notes (Signed)
5mg  iv lopressor given for hr 150-190

## 2013-07-17 NOTE — Progress Notes (Signed)
[facility] Progress Note    Assessment/ Plan:   1. CAD with 3 vessel disease s/p CABG day 3 - Hemodynamically stable and extubated maintaining sats on [place]  - Management per primary team  2. Acute on chronic kidney disease Stage 3 - Baseline cr around 1.8, 2.2 on 9/13.  - likley acute increase due to renal hypoperfusion after CABG 9/12. Initially on pressors, currently stable off pressors.  - Today, creatinine 3.12, likely continued ATN from acute hypoperfusion on 9/12, FeNa 0.1%.  - Continue to monitor chemistries  3. Hyperkalemia  - Likely from renal hypoperfusion with transient hypotension, also mild rhabdo with  CK of 2934 - Could also be secondary to LR, transcellular shifts (beta-blockade, succinylcholine exposure), possibly hemolyzed as several in a row drawn from A-line.  - TTKG 10.84 making hypoaldo less likely acutely.  - UA with hyaline casts and small leukocytes.  - Lasix 80 IV TID for SOB which will also help K  4. Hypertension: Pressors weaned easily, now on Metoprolol and Imdur   5. Anemia - liely of acute blood loss vs dilutional, monitor with CBC   6. Metabolic Bone Disease: Consider checking PTH with CKD3.   Subjective:   No new complaints overnight. Continued chest/abd pain. Dyspnea improved.    Objective:   BP 134/71  Pulse 95  Temp(Src) 98.3 F (36.8 C) (Oral)  Resp 24  Ht 5\' 9"  (1.753 m)  Wt 222 lb 14.2 oz (101.1 kg)  BMI 32.9 kg/m2  SpO2 93%  Intake/Output Summary (Last 24 hours) at 07/17/13 0729 Last data filed at 07/17/13 0700  Gross per 24 hour  Intake  929.4 ml  Output   2565 ml  Net -1635.6 ml   Weight change: -2 lb 13.9 oz (-1.3 kg)  Physical Exam: Gen: sitting in a chair, oxygen mask in place, NAD, pleasant CVS:RRR, no m/r/g, distant heart sounds; dressing in place over sternum Resp:Normal effort with oxygen mask in place, CTAB anteriorly except some wheezing Abd: mild generalized tenderness, distended, tense Ext:  trace to 1+ LE Edema BL  Imaging:  CXR 9/14 IMPRESSION Mildly increased atelectasis with possible small effusion  Renal US 07/16/2013 IMPRESSION:  1. Bilateral renal cysts. No hydronephrosis or diagnostic renal  calculus. Decompressed urinary bladder by Foley catheter.  Labs: BMET  Recent Labs Lab 07/12/13 1356  07/15/13 0115  07/15/13 1024 07/15/13 1029 07/15/13 1656 07/15/13 1700 07/16/13 0343 07/16/13 1800 07/17/13 0330  NA 137  < > 140  --  141 145 144 142 141 141 139  K 4.6  < > 6.2*  < > 4.9 4.6 5.5* 5.5* 5.3* 4.8 4.6  CL 105  < > 109  --  105  --  107 106 104 102 101  CO2 21  --  22  --  25  --   --  26 25 24 25   GLUCOSE 87  < > 128*  --  121* 117* 126* 126* 110* 109* 118*  BUN 28*  < > 29*  --  34*  --  39* 38* 48* 61* 68*  CREATININE 1.80*  < > 1.96*  --  2.27*  --  2.50* 2.62* 3.12* 3.35* 3.12*  CALCIUM 9.4  --  7.8*  --  8.5  --   --  8.5 8.8 8.8 8.5  < > = values in this interval not displayed. CBC  Recent Labs Lab 07/15/13 1700 07/16/13 0343 07/16/13 1800 07/17/13 0330  WBC 20.2* 25.0* 19.9* 15.5*  HGB  9.9* 9.5* 9.1* 8.6*  HCT 30.5* 29.1* 27.8* 25.6*  MCV 92.1 92.1 91.4 89.8  PLT 155 128* 126* 125*    Medications:    . acetaminophen  1,000 mg Oral Q6H   Or  . acetaminophen (TYLENOL) oral liquid 160 mg/5 mL  1,000 mg Per Tube Q6H  . aspirin EC  325 mg Oral Daily   Or  . aspirin  324 mg Per Tube Daily  . atorvastatin  40 mg Oral q1800  . bisacodyl  10 mg Oral Daily   Or  . bisacodyl  10 mg Rectal Daily  . docusate sodium  200 mg Oral Daily  . enoxaparin (LOVENOX) injection  30 mg Subcutaneous Q24H  . furosemide  80 mg Intravenous Q8H  . insulin aspart  0-24 Units Subcutaneous Q4H  . insulin detemir  10 Units Subcutaneous Daily  . isosorbide mononitrate  60 mg Oral Daily  . levalbuterol  0.63 mg Nebulization TID  . metoprolol tartrate  12.5 mg Oral BID   Or  . metoprolol tartrate  12.5 mg Per Tube BID  . pantoprazole  40 mg Oral Daily   . sodium chloride  3 mL Intravenous Q12H      Timmothy Euler, MD  Family Practice PGY-2 07/17/2013, 7:29 AM   I have seen and examined this patient and agree with plan Dr Wendi Snipes.  Renal fx stable to slightly improved, UO good.  K normal. Could DC dopamine.  Appetite poor, will order nepro.  Probably change to PO lasix in AM. Maryah Marinaro T,MD 07/17/2013 9:10 AM

## 2013-07-17 NOTE — Progress Notes (Signed)
Pt rhythm converted to SVT 130-200s sustaining.  5mg  IV lopressor given.  SVT broke to SR 80-90 but then returned to SVT 130-180.  Dr. Prescott Gum paged and made aware.  During conversation with Dr. Prescott Gum pt converted back SR 80-90.  Received orders for Amiodarone Protocol (150mg  Bolus then 60 mg x6 hours then 30 mg).  Pt pacing remain and back up pacer set at AAI at 60.  Will implement orders and continue to monitor pt.

## 2013-07-17 NOTE — Progress Notes (Signed)
3 Days Post-Op Procedure(s) (LRB): CORONARY ARTERY BYPASS GRAFTING (CABG) (N/A) RADIAL ARTERY HARVEST (Left) Subjective: "i want out of here"  Objective: Vital signs in last 24 hours: Temp:  [97.2 F (36.2 Levy)-98.3 F (36.8 Levy)] 98.3 F (36.8 Levy) (09/15 0400) Pulse Rate:  [87-106] 95 (09/15 0700) Cardiac Rhythm:  [-] Normal sinus rhythm (09/15 0700) Resp:  [18-36] 24 (09/15 0700) BP: (111-166)/(64-98) 134/71 mmHg (09/15 0700) SpO2:  [87 %-97 %] 94 % (09/15 0751) FiO2 (%):  [35 %-50 %] 35 % (09/15 0700) Weight:  [222 lb 14.2 oz (101.1 kg)] 222 lb 14.2 oz (101.1 kg) (09/15 0645)  Hemodynamic parameters for last 24 hours:    Intake/Output from previous day: 09/14 0701 - 09/15 0700 In: 929.4 [I.V.:929.4] Out: 2715 [Urine:2715] Intake/Output this shift:    General appearance: alert and anxious Neurologic: intact Heart: regular rate and rhythm Lungs: diminished breath sounds bibasilar Abdomen: distended, tympanitic, nontender  Lab Results:  Recent Labs  07/16/13 1800 07/17/13 0330  WBC 19.9* 15.5*  HGB 9.1* 8.6*  HCT 27.8* 25.6*  PLT 126* 125*   BMET:  Recent Labs  07/16/13 1800 07/17/13 0330  NA 141 139  K 4.8 4.6  CL 102 101  CO2 24 25  GLUCOSE 109* 118*  BUN 61* 68*  CREATININE 3.35* 3.12*  CALCIUM 8.8 8.5    PT/INR:  Recent Labs  07/14/13 1400  LABPROT 16.4*  INR 1.36   ABG    Component Value Date/Time   PHART 7.287* 07/15/2013 1207   HCO3 25.9* 07/15/2013 1207   TCO2 26 07/15/2013 1656   ACIDBASEDEF 1.0 07/15/2013 1207   O2SAT 93.0 07/15/2013 1207   CBG (last 3)   Recent Labs  07/16/13 1957 07/17/13 0001 07/17/13 0344  GLUCAP 91 86 110*    Assessment/Plan: S/P Procedure(s) (LRB): CORONARY ARTERY BYPASS GRAFTING (CABG) (N/A) RADIAL ARTERY HARVEST (Left) - CV- stable  Imdur for radial  RESP- bibasilar atelectasis- IS  Pulmonary edema- diurese  RENAL- acute on chronic renal failure- UO increased now nonoliguric  Continue dopamine  for now- will wean if creatinine down again this PM  Continue diuresis  ANEMIA secondary to ABL- follow  GI- abdominal distention Levy/w ileus- will give reglan for 24 hours  LEUKOCYTOSIS- overall picture, ileus, renal, WBC, pulmonary edema Levy/w SIRS response to CPB, improving   LOS: 3 days    Michael Levy,Michael Levy 07/17/2013

## 2013-07-18 ENCOUNTER — Inpatient Hospital Stay (HOSPITAL_COMMUNITY): Payer: BC Managed Care – PPO

## 2013-07-18 ENCOUNTER — Encounter (HOSPITAL_COMMUNITY): Payer: Self-pay | Admitting: Thoracic Surgery (Cardiothoracic Vascular Surgery)

## 2013-07-18 LAB — CBC
HCT: 25.5 % — ABNORMAL LOW (ref 39.0–52.0)
MCV: 88.5 fL (ref 78.0–100.0)
RBC: 2.88 MIL/uL — ABNORMAL LOW (ref 4.22–5.81)
RDW: 13.7 % (ref 11.5–15.5)
WBC: 18.1 10*3/uL — ABNORMAL HIGH (ref 4.0–10.5)

## 2013-07-18 LAB — GLUCOSE, CAPILLARY
Glucose-Capillary: 115 mg/dL — ABNORMAL HIGH (ref 70–99)
Glucose-Capillary: 116 mg/dL — ABNORMAL HIGH (ref 70–99)
Glucose-Capillary: 90 mg/dL (ref 70–99)

## 2013-07-18 LAB — BASIC METABOLIC PANEL
CO2: 26 mEq/L (ref 19–32)
Chloride: 100 mEq/L (ref 96–112)
Creatinine, Ser: 2.64 mg/dL — ABNORMAL HIGH (ref 0.50–1.35)

## 2013-07-18 MED ORDER — METOPROLOL TARTRATE 25 MG/10 ML ORAL SUSPENSION
25.0000 mg | Freq: Two times a day (BID) | ORAL | Status: DC
  Filled 2013-07-18 (×14): qty 10

## 2013-07-18 MED ORDER — POTASSIUM CHLORIDE 10 MEQ/50ML IV SOLN
10.0000 meq | INTRAVENOUS | Status: AC
  Administered 2013-07-18 (×2): 10 meq via INTRAVENOUS
  Filled 2013-07-18: qty 50

## 2013-07-18 MED ORDER — LACTULOSE 10 GM/15ML PO SOLN
30.0000 g | Freq: Once | ORAL | Status: AC
  Administered 2013-07-18: 30 g via ORAL
  Filled 2013-07-18: qty 45

## 2013-07-18 MED ORDER — POTASSIUM CHLORIDE 10 MEQ/50ML IV SOLN
INTRAVENOUS | Status: AC
  Filled 2013-07-18: qty 50

## 2013-07-18 MED ORDER — ISOSORBIDE MONONITRATE ER 30 MG PO TB24
30.0000 mg | ORAL_TABLET | Freq: Every day | ORAL | Status: DC
  Administered 2013-07-18 – 2013-07-24 (×7): 30 mg via ORAL
  Filled 2013-07-18 (×7): qty 1

## 2013-07-18 MED ORDER — METOCLOPRAMIDE HCL 5 MG/ML IJ SOLN
10.0000 mg | Freq: Four times a day (QID) | INTRAMUSCULAR | Status: AC
  Administered 2013-07-18 – 2013-07-19 (×4): 10 mg via INTRAVENOUS
  Filled 2013-07-18 (×4): qty 2

## 2013-07-18 MED ORDER — METOPROLOL TARTRATE 25 MG PO TABS
25.0000 mg | ORAL_TABLET | Freq: Two times a day (BID) | ORAL | Status: DC
Start: 2013-07-18 — End: 2013-07-24
  Administered 2013-07-18 – 2013-07-24 (×13): 25 mg via ORAL
  Filled 2013-07-18 (×14): qty 1

## 2013-07-18 MED ORDER — FUROSEMIDE 10 MG/ML IJ SOLN
80.0000 mg | Freq: Two times a day (BID) | INTRAMUSCULAR | Status: DC
  Administered 2013-07-18: 80 mg via INTRAVENOUS
  Filled 2013-07-18 (×2): qty 8

## 2013-07-18 NOTE — Progress Notes (Signed)
[facility] Progress Note    Assessment/ Plan:   1. CAD with 3 vessel disease s/p CABG day 4 - Hemodynamically stable and extubated maintaining sats on [place]  - Management per primary team  2. Acute on chronic kidney disease Stage 3 - Baseline cr around 1.8, 2.2 on 9/13.  - likley acute increase due to renal hypoperfusion after CABG 9/12. Initially on pressors, currently stable off pressors.  - Today, creatinine is improved to 2.64, likely improving ATN from acute hypoperfusion on 9/12, FeNa 0.1%.  - Continue to monitor chemistries  3. Hyperkalemia-resolved-3.5 today - Likely from renal hypoperfusion with transient hypotension, also mild rhabdo with CK of 2934 - Could also be secondary to LR, transcellular shifts (beta-blockade, succinylcholine exposure), possibly hemolyzed as several in a row drawn from A-line.  - TTKG 10.84 making hypoaldo less likely acutely.  - UA with hyaline casts and small leukocytes.  - yesterday had good UOP with Lasix 80 IV TID for SOB-will likely transition to Lasix PO today. - continue to monitor potassium-will replete as needed if low  4. Hypertension: Pressors weaned, now on Metoprolol and Imdur   5. Anemia - stable, likely of acute blood loss vs dilutional, will monitor with CBC   6. Metabolic Bone Disease: Consider checking PTH with CKD3.   Subjective:   States is anxious to get out of the hospital. Is tired of looking at the same room for the past 5 days. Otherwise no new complaints.    Objective:   BP 129/73  Pulse 102  Temp(Src) 97.8 F (36.6 C) (Oral)  Resp 33  Ht 5\' 9"  (1.753 m)  Wt 219 lb (99.338 kg)  BMI 32.33 kg/m2  SpO2 93%  Intake/Output Summary (Last 24 hours) at 07/18/13 0804 Last data filed at 07/18/13 0700  Gross per 24 hour  Intake 1128.5 ml  Output   3025 ml  Net -1896.5 ml   Weight change: -3 lb 14.2 oz (-1.762 kg)  Physical Exam: Gen: NAD, sitting comfortably in chair CVS: rrr, no mrg appreciated,  sternotomy scar well healing Resp: bibasilar crackles, otherwise clear Abd: NT, distended, tense Ext: trace LE Edema BL  Imaging:  CXR 9/14 IMPRESSION Mildly increased atelectasis with possible small effusion  Renal US 07/16/2013 IMPRESSION:  1. Bilateral renal cysts. No hydronephrosis or diagnostic renal  calculus. Decompressed urinary bladder by Foley catheter.  Labs: BMET  Recent Labs Lab 07/15/13 1024  07/15/13 1656 07/15/13 1700 07/16/13 0343 07/16/13 1800 07/17/13 0330 07/17/13 1400 07/18/13 0400  NA 141  < > 144 142 141 141 139 137 140  K 4.9  < > 5.5* 5.5* 5.3* 4.8 4.6 3.8 3.5  CL 105  --  107 106 104 102 101 99 100  CO2 25  --   --  26 25 24 25 25 26   GLUCOSE 121*  < > 126* 126* 110* 109* 118* 166* 123*  BUN 34*  --  39* 38* 48* 61* 68* 72* 75*  CREATININE 2.27*  --  2.50* 2.62* 3.12* 3.35* 3.12* 2.88* 2.64*  CALCIUM 8.5  --   --  8.5 8.8 8.8 8.5 9.2 9.0  PHOS  --   --   --   --   --   --  5.1*  --   --   < > = values in this interval not displayed. CBC  Recent Labs Lab 07/16/13 0343 07/16/13 1800 07/17/13 0330 07/18/13 0400  WBC 25.0* 19.9* 15.5* 18.1*  HGB 9.5* 9.1* 8.6*  8.6*  HCT 29.1* 27.8* 25.6* 25.5*  MCV 92.1 91.4 89.8 88.5  PLT 128* 126* 125* 169    Medications:    . acetaminophen  1,000 mg Oral Q6H   Or  . acetaminophen (TYLENOL) oral liquid 160 mg/5 mL  1,000 mg Per Tube Q6H  . aspirin EC  325 mg Oral Daily   Or  . aspirin  324 mg Per Tube Daily  . atorvastatin  40 mg Oral q1800  . bisacodyl  10 mg Oral Daily   Or  . bisacodyl  10 mg Rectal Daily  . docusate sodium  200 mg Oral Daily  . enoxaparin (LOVENOX) injection  30 mg Subcutaneous Q24H  . feeding supplement (NEPRO CARB STEADY)  237 mL Oral BID BM  . furosemide  80 mg Intravenous Q8H  . insulin aspart  0-15 Units Subcutaneous TID WC  . insulin detemir  10 Units Subcutaneous Daily  . isosorbide mononitrate  60 mg Oral Daily  . levalbuterol  0.63 mg Nebulization TID  .  metoprolol tartrate  12.5 mg Oral BID   Or  . metoprolol tartrate  12.5 mg Per Tube BID  . pantoprazole  40 mg Oral Daily  . sodium chloride  3 mL Intravenous Q12H      Leone Haven, MD  Family Practice PGY-2 07/18/2013, 8:04 AM   I have seen and examined this patient and agree with plan per Dr Biagio Quint.  Renal fx slowly improving toward baseline.  UO excellent.  Agree with decreasing lasix but could change to PO.  Cont with daily Scr. DC foley. Ladaysha Soutar T,MD 07/18/2013 10:18 AM

## 2013-07-18 NOTE — Progress Notes (Signed)
Feels better tonight  BP 135/71  Pulse 87  Temp(Src) 97.8 F (36.6 C) (Oral)  Resp 15  Ht 5\' 9"  (1.753 m)  Wt 219 lb (99.338 kg)  BMI 32.33 kg/m2  SpO2 98%   Intake/Output Summary (Last 24 hours) at 07/18/13 1951 Last data filed at 07/18/13 1800  Gross per 24 hour  Intake 1453.7 ml  Output   2225 ml  Net -771.3 ml    Ambulated 3 times today  Abdomen still distended, nontender- check KUB in AM

## 2013-07-18 NOTE — Progress Notes (Signed)
4 Days Post-Op Procedure(s) (LRB): CORONARY ARTERY BYPASS GRAFTING (CABG) (N/A) RADIAL ARTERY HARVEST (Left) Subjective: Anxious this AM + BM last night Rapid a fib last night- started on amiodarone  Objective: Vital signs in last 24 hours: Temp:  [97.6 F (36.4 C)-98.1 F (36.7 C)] 97.8 F (36.6 C) (09/16 0759) Pulse Rate:  [80-109] 84 (09/16 0800) Cardiac Rhythm:  [-] Normal sinus rhythm (09/16 0800) Resp:  [14-33] 33 (09/16 0700) BP: (94-166)/(62-98) 132/71 mmHg (09/16 0800) SpO2:  [92 %-100 %] 99 % (09/16 0800) FiO2 (%):  [35 %-40 %] 40 % (09/15 1600) Weight:  [219 lb (99.338 kg)] 219 lb (99.338 kg) (09/16 0500)  Hemodynamic parameters for last 24 hours:    Intake/Output from previous day: 09/15 0701 - 09/16 0700 In: 1149.1 [P.O.:100; I.V.:1049.1] Out: 3225 [Urine:3225] Intake/Output this shift: Total I/O In: 56.7 [I.V.:56.7] Out: 250 [Urine:250]  General appearance: alert and cooperative Neurologic: intact Heart: regular rate and rhythm Lungs: diminished breath sounds bibasilar Abdomen: distended, tympanitic Wound: clean and dry  Lab Results:  Recent Labs  07/17/13 0330 07/18/13 0400  WBC 15.5* 18.1*  HGB 8.6* 8.6*  HCT 25.6* 25.5*  PLT 125* 169   BMET:  Recent Labs  07/17/13 1400 07/18/13 0400  NA 137 140  K 3.8 3.5  CL 99 100  CO2 25 26  GLUCOSE 166* 123*  BUN 72* 75*  CREATININE 2.88* 2.64*  CALCIUM 9.2 9.0    PT/INR: No results found for this basename: LABPROT, INR,  in the last 72 hours ABG    Component Value Date/Time   PHART 7.287* 07/15/2013 1207   HCO3 25.9* 07/15/2013 1207   TCO2 26 07/15/2013 1656   ACIDBASEDEF 1.0 07/15/2013 1207   O2SAT 93.0 07/15/2013 1207   CBG (last 3)   Recent Labs  07/17/13 1712 07/17/13 2254 07/18/13 0757  GLUCAP 140* 139* 115*    Assessment/Plan: S/P Procedure(s) (LRB): CORONARY ARTERY BYPASS GRAFTING (CABG) (N/A) RADIAL ARTERY HARVEST (Left) - CV- amiodarone gtt for SVT- now in SR,  continue IV amiodarone today, increase lopressor  Imdur for radial graft  RESP- bibasilar atelectasis- continue IS  RENAL- acute on chronic RF- creatinine improving  Will decrease lasix to 80 mg BID  2 runs of KCL for K=3.5 with arrythmia  ENDO- CBG well controlled  GI -- ileus may be starting to recover but still very distended- reglan for another 24 hours  Ambulate BID   LOS: 4 days    HENDRICKSON,STEVEN C 07/18/2013

## 2013-07-19 ENCOUNTER — Encounter (HOSPITAL_COMMUNITY): Payer: Self-pay | Admitting: Physician Assistant

## 2013-07-19 ENCOUNTER — Inpatient Hospital Stay (HOSPITAL_COMMUNITY): Payer: BC Managed Care – PPO

## 2013-07-19 DIAGNOSIS — R1084 Generalized abdominal pain: Secondary | ICD-10-CM

## 2013-07-19 DIAGNOSIS — K929 Disease of digestive system, unspecified: Secondary | ICD-10-CM

## 2013-07-19 DIAGNOSIS — E876 Hypokalemia: Secondary | ICD-10-CM

## 2013-07-19 DIAGNOSIS — K560 Paralytic ileus: Secondary | ICD-10-CM

## 2013-07-19 LAB — COMPREHENSIVE METABOLIC PANEL
ALT: 23 U/L (ref 0–53)
AST: 27 U/L (ref 0–37)
Calcium: 9.5 mg/dL (ref 8.4–10.5)
Creatinine, Ser: 2.96 mg/dL — ABNORMAL HIGH (ref 0.50–1.35)
GFR calc Af Amer: 27 mL/min — ABNORMAL LOW (ref >90)
GFR calc non Af Amer: 23 mL/min — ABNORMAL LOW (ref >90)
Sodium: 142 mEq/L (ref 135–145)
Total Protein: 6.9 g/dL (ref 6.0–8.3)

## 2013-07-19 LAB — CBC
MCH: 30.1 pg (ref 26.0–34.0)
MCHC: 33.8 g/dL (ref 30.0–36.0)
MCV: 89 fL (ref 78.0–100.0)
Platelets: 214 10*3/uL (ref 150–400)

## 2013-07-19 LAB — GLUCOSE, CAPILLARY: Glucose-Capillary: 173 mg/dL — ABNORMAL HIGH (ref 70–99)

## 2013-07-19 MED ORDER — FUROSEMIDE 10 MG/ML IJ SOLN: 80.0000 mg | Freq: Every day | INTRAMUSCULAR | Status: DC

## 2013-07-19 MED ORDER — POTASSIUM CHLORIDE 10 MEQ/50ML IV SOLN
10.0000 meq | INTRAVENOUS | Status: AC
  Administered 2013-07-19 (×2): 10 meq via INTRAVENOUS
  Filled 2013-07-19 (×2): qty 50

## 2013-07-19 MED ORDER — FLEET ENEMA 7-19 GM/118ML RE ENEM
1.0000 | ENEMA | Freq: Once | RECTAL | Status: DC
  Filled 2013-07-19: qty 1

## 2013-07-19 MED ORDER — METRONIDAZOLE IN NACL 5-0.79 MG/ML-% IV SOLN
500.0000 mg | Freq: Three times a day (TID) | INTRAVENOUS | Status: DC
  Administered 2013-07-19 – 2013-07-20 (×3): 500 mg via INTRAVENOUS
  Filled 2013-07-19 (×5): qty 100

## 2013-07-19 MED ORDER — SODIUM CHLORIDE 0.45 % IV SOLN
INTRAVENOUS | Status: DC
  Administered 2013-07-19 – 2013-07-20 (×2): via INTRAVENOUS
  Administered 2013-07-20: 75 mL via INTRAVENOUS

## 2013-07-19 MED ORDER — FUROSEMIDE 80 MG PO TABS
80.0000 mg | ORAL_TABLET | Freq: Every day | ORAL | Status: DC
  Administered 2013-07-19 – 2013-07-23 (×5): 80 mg via ORAL
  Filled 2013-07-19 (×5): qty 1

## 2013-07-19 NOTE — Progress Notes (Signed)
[facility] Progress Note    Assessment/ Plan:   1. CAD with 3 vessel disease s/p CABG day 4 - Hemodynamically stable and extubated maintaining sats on [place] 3 L - Management per primary team  2. Acute on chronic kidney disease Stage 3 - Baseline cr around 1.8, 2.2 on 9/13.  - likley acute increase due to renal hypoperfusion after CABG 9/12. Initially on pressors, currently stable off pressors.  - Cr worsened today to 2.96-may be related to over use of lasix, lasix decreased by primary to once daily, consider transition to PO lasix - Continue to monitor chemistries  3. Hypokalemia: likely related to diuresis with lasix - will replete today  4. Hypertension: controlled on Metoprolol and Imdur   5. Anemia - improving, likely of acute blood loss vs dilutional, will monitor with CBC   6. Metabolic Bone Disease: Consider checking PTH with CKD3.   Subjective:   Doing ok this morning. Had several BMs yesterday. Per nursing report the patient has not been eating well. He has been urinating well since removal of the foley.   Objective:   BP 112/67  Pulse 76  Temp(Src) 97.5 F (36.4 C) (Oral)  Resp 12  Ht 5\' 9"  (1.753 m)  Wt 215 lb 13.3 oz (97.9 kg)  BMI 31.86 kg/m2  SpO2 96%  Intake/Output Summary (Last 24 hours) at 07/19/13 [id number] Last data filed at 07/19/13 0700  Gross per 24 hour  Intake 1280.8 ml  Output   2202 ml  Net -921.2 ml   Weight change: -3 lb 2.7 oz (-1.438 kg)  Physical Exam: Gen: NAD, laying comfortably in bed CVS: rrr, no mrg appreciated, sternotomy scar well healing Resp: bibasilar crackles increased from yesterday, otherwise clear Abd: NT, distended, tense Ext: 1+ LE Edema BL  Imaging:  CXR 9/14 IMPRESSION Mildly increased atelectasis with possible small effusion  Renal US 07/16/2013 IMPRESSION:  1. Bilateral renal cysts. No hydronephrosis or diagnostic renal  calculus. Decompressed urinary bladder by Foley catheter.  Dg Chest Port  1 View  07/18/2013  IMPRESSION: Stable with infrahilar atelectasis.    Dg Abd Portable 1v  07/19/2013   IMPRESSION: Gas distention of the colon. No evidence of obstruction.     Labs: BMET  Recent Labs Lab 07/15/13 1700 07/16/13 0343 07/16/13 1800 07/17/13 0330 07/17/13 1400 07/18/13 0400 07/19/13 0420  NA 142 141 141 139 137 140 142  K 5.5* 5.3* 4.8 4.6 3.8 3.5 3.4*  CL 106 104 102 101 99 100 100  CO2 26 25 24 25 25 26 27   GLUCOSE 126* 110* 109* 118* 166* 123* 118*  BUN 38* 48* 61* 68* 72* 75* 89*  CREATININE 2.62* 3.12* 3.35* 3.12* 2.88* 2.64* 2.96*  CALCIUM 8.5 8.8 8.8 8.5 9.2 9.0 9.5  PHOS  --   --   --  5.1*  --   --   --    CBC  Recent Labs Lab 07/16/13 1800 07/17/13 0330 07/18/13 0400 07/19/13 0420  WBC 19.9* 15.5* 18.1* 19.2*  HGB 9.1* 8.6* 8.6* 9.0*  HCT 27.8* 25.6* 25.5* 26.6*  MCV 91.4 89.8 88.5 89.0  PLT 126* 125* 169 214    Medications:    . acetaminophen  1,000 mg Oral Q6H   Or  . acetaminophen (TYLENOL) oral liquid 160 mg/5 mL  1,000 mg Per Tube Q6H  . aspirin EC  325 mg Oral Daily   Or  . aspirin  324 mg Per Tube Daily  . atorvastatin  40 mg  Oral q1800  . bisacodyl  10 mg Oral Daily   Or  . bisacodyl  10 mg Rectal Daily  . docusate sodium  200 mg Oral Daily  . enoxaparin (LOVENOX) injection  30 mg Subcutaneous Q24H  . feeding supplement (NEPRO CARB STEADY)  237 mL Oral BID BM  . furosemide  80 mg Intravenous BID  . insulin aspart  0-15 Units Subcutaneous TID WC  . insulin detemir  10 Units Subcutaneous Daily  . isosorbide mononitrate  30 mg Oral Daily  . levalbuterol  0.63 mg Nebulization TID  . metoprolol tartrate  25 mg Oral BID   Or  . metoprolol tartrate  25 mg Per Tube BID  . pantoprazole  40 mg Oral Daily  . sodium chloride  3 mL Intravenous Q12H      Leone Haven, MD  Family Practice PGY-2 07/19/2013, 7:38 AM  I have seen and examined this patient and agree with plan per Dr Biagio Quint.  UO remains good though Scr sl  up.  Notice that lasix dose decreased so will just switch to PO.  Recheck labs in AM.  He had some low BP last night that may have effected renal Fx.Marland Kitchen Jhamir Pickup T,MD 07/19/2013 9:50 AM

## 2013-07-19 NOTE — Consult Note (Signed)
[facility] Consult: 12:16 PM 07/19/2013   Referring Provider: Dr Roxan Hockey.  Primary Care Physician:  Shelda Jakes, occupational NP at [facility] auto auction.  Primary Gastroenterologist:  None .  Unassigned.    Reason for Consultation:  Abdominal distention and cramping.   HPI: Michael Levy is a 47 y.o. male.  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  Hx MI with cardiac stenting at age 52, additional stents in 2010.  Smoking 1 - 2 PPD at time of admission.  No previous GI issues.  Has developed post op anemia, though Hgb was 12.6 at admission.  Normal MCV Post op tachycardia currently on Amiodarone drip. Low dose dopamine discontinued on 9/15  Since surgery has had abdominal distention, bil pain in lower abdomen.  Scores pain at 7 to 8/10 but at same time says it is not that bad. Pain is not worsened by eating, he is just not hungry.  No nausea. Currently stools are watery loose and he had 4 of these yesterday, one so far today. At home has soft, formed stools about once per day.  Has his first abdominal imaging today.  The 1 view film shows colonic distention without obvious obstruction.   Was getting qid IV Reglan from 9/15 to 9/17, now discontinued.   Doses of Lactulose 30 cc on 9/15 and 9/16. Also discontinued.  Once daily Colace and Dulcolax, ongoing.  Taking oxycodone prn for post op pain, 3 doses yesterday, one today. Morphine DCd 9/14 Started on empiric IV flagyl pending result of C diff study.  Awaiting stool spec to send to lab.   Drinks very little, one beer or the equivalent less than 10 times per year.  Never advised of problems with liver tests.     Past Medical History  Diagnosis Date  . Hyperlipidemia   . Gout   . Anginal pain   . Anemia     Unknown cause  . Periodontal disease   . Heart attack 2005    One stent placed; 2 years later had 4 more stents placed  . Reflux   .  Hypertension     Essential  . Rapid heart rate   . Chronic kidney disease (CKD), stage II (mild)     scored as stage 3 in 07/2013 with acute on chronic renal failure post CABG  . Emphysema 2014  . Headache(784.0)     occasional migraines  . Arthritis   . Atherosclerosis 06/2013    per CT age-advanced atherosclerosis of the aorta, great vessels and coronaries.  . Bilateral renal cysts 07/2013    per renal ultrasound     Past Surgical History  Procedure Laterality Date  . Coronary angioplasty with stent placement    . Cardiac catheterization      06/30/13 Dr Martinique  . Tonsillectomy    . Coronary artery bypass graft N/A 07/14/2013    Procedure: CORONARY ARTERY BYPASS GRAFTING (CABG);  Surgeon: Melrose Nakayama, MD;  Location: [facility];  Service: Open Heart Surgery;  Laterality: N/A;  . Radial artery harvest Left 07/14/2013    Procedure: RADIAL ARTERY HARVEST;  Surgeon: Melrose Nakayama, MD;  Location: [facility];  Service: Vascular;  Laterality: Left;    Prior to Admission medications   Medication Sig Start Date End Date Taking? Authorizing Provider  atorvastatin (LIPITOR) 40 MG tablet Take 40 mg by mouth daily.   Yes Historical Provider, MD  isosorbide mononitrate (IMDUR) 60 MG 24 hr tablet Take 1  tablet (60 mg total) by mouth daily. 06/30/13  Yes Rogelia Mire, NP  metoprolol tartrate (LOPRESSOR) 25 MG tablet Take 25 mg by mouth daily. 06/08/13  Yes Historical Provider, MD  aspirin 325 MG tablet Take 325 mg by mouth daily.    Historical Provider, MD    Scheduled Meds: . acetaminophen  1,000 mg Oral Q6H   Or  . acetaminophen (TYLENOL) oral liquid 160 mg/5 mL  1,000 mg Per Tube Q6H  . aspirin EC  325 mg Oral Daily   Or  . aspirin  324 mg Per Tube Daily  . atorvastatin  40 mg Oral q1800  . bisacodyl  10 mg Oral Daily   Or  . bisacodyl  10 mg Rectal Daily  . docusate sodium  200 mg Oral Daily  . enoxaparin (LOVENOX) injection  30 mg Subcutaneous Q24H  . feeding supplement  (NEPRO CARB STEADY)  237 mL Oral BID BM  . furosemide  80 mg Oral Daily  . insulin aspart  0-15 Units Subcutaneous TID WC  . insulin detemir  10 Units Subcutaneous Daily  . isosorbide mononitrate  30 mg Oral Daily  . levalbuterol  0.63 mg Nebulization TID  . metoprolol tartrate  25 mg Oral BID   Or  . metoprolol tartrate  25 mg Per Tube BID  . metronidazole  500 mg Intravenous Q8H  . pantoprazole  40 mg Oral Daily  . sodium chloride  3 mL Intravenous Q12H   Infusions: . sodium chloride 20 mL/hr (07/15/13 0800)  . sodium chloride 75 mL/hr at 07/19/13 0900  . sodium chloride 20 mL/hr (07/15/13 0800)  . sodium chloride    . sodium chloride 20 mL/hr at 07/18/13 0900  . amiodarone (NEXTERONE PREMIX) 360 mg/200 mL dextrose 30 mg/hr (07/19/13 1200)  . dextrose 5 % and 0.45% NaCl    . DOPamine Stopped (07/17/13 1857)  . nitroGLYCERIN Stopped (07/15/13 1400)  . phenylephrine (NEO-SYNEPHRINE) Adult infusion Stopped (07/14/13 2200)   PRN Meds: dextrose, metoprolol, ondansetron (ZOFRAN) IV, oxyCODONE, sodium chloride   Allergies as of 07/05/2013 - Review Complete 07/04/2013  Allergen Reaction Noted  . Allopurinol  06/28/2013    Family History  Problem Relation Age of Onset  . Diabetes Mother   . Hyperlipidemia Mother   . Hypertension Mother   . Cancer of prostate Father   . Hyperlipidemia Father   . Hypertension Father   . Stroke Father   . Diabetes Maternal Grandmother   . Coronary artery disease Mother 60    CABG at age 37  +   Brain aneurysm:  Cause of father's death age 21  History   Social History  . Marital Status: Lives with his fiance.     Spouse Name: N/A    Number of Children: 0  . Years of Education: N/A   Occupational History  . General maintance   .     Social History Main Topics  . Smoking status: Current Every Day Smoker -- 2.00 packs/day for 36 years    Types: Cigarettes    Start date: 11/02/1977  . Smokeless tobacco: Former Systems developer  . Alcohol Use:  Yes     Comment: 2 beers once a month  . Drug Use: No     Comment: formerly  . Sexual Activity: Not on file   Social History Narrative  . Lives with fiance and her adult son.     REVIEW OF SYSTEMS: Constitutional:  Stable weight.  Good appetite PTA and leading up  to surgery ENT:  No nose bleed.  Poor dentition but no dental pain. Pulm:  Dry cough, DOE.  CV:  No lipid testing in Epic. New pedal edema.   GU:  No oliguria, no hematuria.  Refuses to undergo prostate exams GI:  Per HPI Heme:  Has been told he had anemia in past.  Never referred to specialist for workup. .    Transfusions:  None ever Neuro:  No headache, no dizziness, no peripheral numbness.  Derm:  No rash, itching or sores Endocrine:  No excessive thirst or urination.  No hx thyroid disease.  No sweats or chills Immunization:  No flu vaccine this year Travel:  None.    PHYSICAL EXAM: Vital signs in last 24 hours: Temp:  [97.5 F (36.4 C)-98 F (36.7 C)] 98 F (36.7 C) (09/17 1153) Pulse Rate:  [64-96] 76 (09/17 1200) Resp:  [11-27] 17 (09/17 1200) BP: (99-160)/(43-95) 121/66 mmHg (09/17 1200) SpO2:  [90 %-100 %] 100 % (09/17 1200) Weight:  [97.9 kg (215 lb 13.3 oz)] 97.9 kg (215 lb 13.3 oz) (09/17 0500)  General: weak, looks unwell, not toxic. Head:  No asymmetry or swelling  Eyes:  No icterus or pallor Ears:  Not HOH  Nose:  No discharge or congestion Mouth:  Poor dentition, lots of plaque and many absent teeth.  Mucosa pink, moist and clear Neck:  No JVD, no bruits.  No TMG Lungs:  Exp wheezes. Diminished BS globally.  No cough.  Mild dyspnea with speach Heart: RRR.  No MRG Abdomen:  Tense, distended, tender in right lower abdomen.  No mass.  No HSM.  No bruits.   Rectal: pt declined exam   Musc/Skeltl: no joint swelling or deformity Extremities:  1 to 2 plus pedal edema  Neurologic:  Oriented x 3.  Drowsy but easily aroused.  No limb weakness.  Right forearm with bandage covering vein graft harvest  site.  Skin:  No telangectasia or sores.  Tattoos:  Several: on trunk, limbs. Nodes:  No inguinal adenopathy.    Psych:  Pleasant,  Blunt affect. Not agitated or anxious.   Intake/Output from previous day: 09/16 0701 - 09/17 0700 In: 1280.8 [P.O.:180; I.V.:1000.8; IV Piggyback:100] Out: 2202 [Urine:2200; Stool:2] Intake/Output this shift: Total I/O In: 503.5 [I.V.:403.5; IV Piggyback:100] Out: -   LAB RESULTS:  Recent Labs  07/17/13 0330 07/18/13 0400 07/19/13 0420  WBC 15.5* 18.1* 19.2*  HGB 8.6* 8.6* 9.0*  HCT 25.6* 25.5* 26.6*  PLT 125* 169 214   BMET Lab Results  Component Value Date   NA 142 07/19/2013   NA 140 07/18/2013   NA 137 07/17/2013   K 3.4* 07/19/2013   K 3.5 07/18/2013   K 3.8 07/17/2013   CL 100 07/19/2013   CL 100 07/18/2013   CL 99 07/17/2013   CO2 27 07/19/2013   CO2 26 07/18/2013   CO2 25 07/17/2013   GLUCOSE 118* 07/19/2013   GLUCOSE 123* 07/18/2013   GLUCOSE 166* 07/17/2013   BUN 89* 07/19/2013   BUN 75* 07/18/2013   BUN 72* 07/17/2013   CREATININE 2.96* 07/19/2013   CREATININE 2.64* 07/18/2013   CREATININE 2.88* 07/17/2013   CALCIUM 9.5 07/19/2013   CALCIUM 9.0 07/18/2013   CALCIUM 9.2 07/17/2013   LFT  Recent Labs  07/17/13 0330 07/19/13 0420  PROT 6.7 6.9  ALBUMIN 2.5* 2.6*  AST 42* 27  ALT 38 23  ALKPHOS 67 120*  BILITOT 0.4 0.3   PT/INR Lab Results  Component  Value Date   INR 1.36 07/14/2013   INR 1.03 07/12/2013   INR 1.2* 06/28/2013    RADIOLOGY STUDIES: Dg Chest Port 1 View 07/18/2013     FINDINGS: Portable AP upright view at 0618 hrs. Stable lung volumes. Stable right IJ introducer sheath. Stable cardiomegaly and mediastinal contours. Sequelae of CABG. No pneumothorax. No definite pleural effusion. Mild infrahilar atelectasis not significantly changed.  IMPRESSION: Stable with infrahilar atelectasis.   Electronically Signed   By: Lars Pinks M.D.   On: 07/18/2013 08:09   Dg Abd Portable 1v 07/19/2013     FINDINGS: There is gaseous  distension of the colon which is not pathologically dilated. No clear small bowel dilatation. Gas in the sigmoid colon and rectum. No pathologic calcifications.  IMPRESSION: Gas distention of the colon. No evidence of obstruction.   Electronically Signed   By: Suzy Bouchard M.D.   On: 07/19/2013 08:18    ENDOSCOPIC STUDIES: None ever.   IMPRESSION: *  Abdominal distention and pain.  Rule out ischemic insult to colon due to peri-operative hypoperfusion. Rule out post op/narcotic induced ileus.  *  S/P CABG 07/15/2011.  Preop severe 3 vessel disease with progressive angina *  Acute on chronic stage 3 kidney disease due to peri op renal hypoperfusion/ATN per Dr Mercy Moore. Renal cysts on ultrasound. Non-oliguric.  *  Post-op anemia.  MCV normocytic.  *  Thrombocytopenia, post-op.  Resolved.    PLAN: *  Ordered a stool hemoccult. ti in addition to the C diff PCR that was ordered by Dr Roxan Hockey.  *  If CT imaging, will need to be non-contrast due to renal failure.    LOS: 5 days   Azucena Freed  07/19/2013, 12:16 PM Pager: [phone]  GI ATTENDING  History, laboratories, x-rays reviewed. Patient personally seen and examined. Mother in room. We are asked to see the patient regarding abdominal distention. Agree with H&P as outlined above. Examination reveals abdominal distention with high-pitched bowel sounds. Nontender.  IMPRESSION 1. Postop ileus. Moderate radiographically and clinically. Question concurrent ascites 2. Hypokalemia 3. Multiple medical problems  RECOMMENDATIONS 1. Primary service to correct hypokalemia, as this decrease his GI motility. Monitor electrolytes daily 2. Agree with discontinuing lactulose, as this produces gas 3. Agree with minimizing narcotics, as this decrease his GI motility 4. Increased activity. Move side to side. Out of bed to chair often. This will increase gut motility 5. Continue n.p.o. with sips and ice chips for now 6. Check ultrasound in a.m.  to rule out ascites 7. Repeat KUB in a.m.       GI will follow. Thank you  Docia Chuck. Geri Seminole., M.D. [facility] Division of Gastroenterology

## 2013-07-19 NOTE — Progress Notes (Signed)
Patient ID: Michael Levy, male   DOB: Feb 06, 1966, 47 y.o.   MRN: [id number]  SICU Evening Rounds:  Hemodynamically stable  Urine output ok  Abdomen remains distended, colonic dilatation on KUB this am. Taking some liquids.

## 2013-07-19 NOTE — Progress Notes (Signed)
5 Days Post-Op Procedure(s) (LRB): CORONARY ARTERY BYPASS GRAFTING (CABG) (N/A) RADIAL ARTERY HARVEST (Left) Subjective: Still c/o abdominal distention, some cramping + small BM last night  Objective: Vital signs in last 24 hours: Temp:  [97.5 F (36.4 C)-98 F (36.7 C)] 98 F (36.7 C) (09/17 0800) Pulse Rate:  [64-92] 76 (09/17 0700) Cardiac Rhythm:  [-] Normal sinus rhythm (09/17 0800) Resp:  [11-27] 12 (09/17 0700) BP: (99-149)/(43-95) 112/67 mmHg (09/17 0700) SpO2:  [90 %-100 %] 97 % (09/17 0820) Weight:  [215 lb 13.3 oz (97.9 kg)] 215 lb 13.3 oz (97.9 kg) (09/17 0500)  Hemodynamic parameters for last 24 hours:    Intake/Output from previous day: 09/16 0701 - 09/17 0700 In: 1280.8 [P.O.:180; I.V.:1000.8; IV Piggyback:100] Out: 2202 [Urine:2200; Stool:2] Intake/Output this shift:    General appearance: alert and no distress Neurologic: intact Heart: regular rate and rhythm Lungs: diminished breath sounds bibasilar Abdomen: distended, tympanitic, nontender, + "tinkling" BS Wound: intact  Lab Results:  Recent Labs  07/18/13 0400 07/19/13 0420  WBC 18.1* 19.2*  HGB 8.6* 9.0*  HCT 25.5* 26.6*  PLT 169 214   BMET:  Recent Labs  07/18/13 0400 07/19/13 0420  NA 140 142  K 3.5 3.4*  CL 100 100  CO2 26 27  GLUCOSE 123* 118*  BUN 75* 89*  CREATININE 2.64* 2.96*  CALCIUM 9.0 9.5    PT/INR: No results found for this basename: LABPROT, INR,  in the last 72 hours ABG    Component Value Date/Time   PHART 7.287* 07/15/2013 1207   HCO3 25.9* 07/15/2013 1207   TCO2 26 07/15/2013 1656   ACIDBASEDEF 1.0 07/15/2013 1207   O2SAT 93.0 07/15/2013 1207   CBG (last 3)   Recent Labs  07/18/13 1744 07/18/13 2200 07/19/13 0826  GLUCAP 92 90 102*    Assessment/Plan: S/P Procedure(s) (LRB): CORONARY ARTERY BYPASS GRAFTING (CABG) (N/A) RADIAL ARTERY HARVEST (Left) - CV- converted top SR with amiodarone- continue IV  RESP- bibasilar atelectasis secondary to  abdominal distention  RENAL- creatinine up slightly, likely over diuresed in setting of ileus, decrease Lasix, give IVF  GI- ileus persists, did have several very small BM- "watery " per RN, will give Fleet enema this AM  Check c. Diff, start empiric flagyl pending result  CBG well controlled   LOS: 5 days    Lisel Siegrist C 07/19/2013

## 2013-07-20 ENCOUNTER — Inpatient Hospital Stay (HOSPITAL_COMMUNITY): Payer: BC Managed Care – PPO

## 2013-07-20 DIAGNOSIS — R933 Abnormal findings on diagnostic imaging of other parts of digestive tract: Secondary | ICD-10-CM

## 2013-07-20 LAB — CBC
HCT: 26.9 % — ABNORMAL LOW (ref 39.0–52.0)
Hemoglobin: 8.9 g/dL — ABNORMAL LOW (ref 13.0–17.0)
MCH: 29.7 pg (ref 26.0–34.0)
MCHC: 33.1 g/dL (ref 30.0–36.0)

## 2013-07-20 LAB — GLUCOSE, CAPILLARY: Glucose-Capillary: 85 mg/dL (ref 70–99)

## 2013-07-20 LAB — BASIC METABOLIC PANEL
BUN: 89 mg/dL — ABNORMAL HIGH (ref 6–23)
GFR calc non Af Amer: 27 mL/min — ABNORMAL LOW (ref >90)
Glucose, Bld: 110 mg/dL — ABNORMAL HIGH (ref 70–99)
Potassium: 3.6 mEq/L (ref 3.5–5.1)

## 2013-07-20 MED ORDER — LEVALBUTEROL HCL 0.63 MG/3ML IN NEBU: 0.6300 mg | INHALATION_SOLUTION | Freq: Four times a day (QID) | RESPIRATORY_TRACT | Status: DC | PRN

## 2013-07-20 MED ORDER — POTASSIUM CHLORIDE 10 MEQ/50ML IV SOLN
10.0000 meq | INTRAVENOUS | Status: AC
  Administered 2013-07-20 (×2): 10 meq via INTRAVENOUS
  Filled 2013-07-20 (×2): qty 50

## 2013-07-20 NOTE — Progress Notes (Signed)
6 Days Post-Op Procedure(s) (LRB): CORONARY ARTERY BYPASS GRAFTING (CABG) (N/A) RADIAL ARTERY HARVEST (Left) Subjective: "i'm tired of being in this room" Still has some abdominal discomfort + BM this am- "mucousy per RN"  Objective: Vital signs in last 24 hours: Temp:  [98 F (36.7 C)-98.6 F (37 C)] 98.2 F (36.8 C) (09/18 0812) Pulse Rate:  [65-96] 87 (09/18 0800) Cardiac Rhythm:  [-] Normal sinus rhythm (09/18 0800) Resp:  [13-30] 23 (09/18 0800) BP: (103-161)/(59-83) 161/83 mmHg (09/18 0800) SpO2:  [96 %-100 %] 97 % (09/18 0820) Weight:  [217 lb 9.5 oz (98.7 kg)] 217 lb 9.5 oz (98.7 kg) (09/18 0500)  Hemodynamic parameters for last 24 hours:    Intake/Output from previous day: 09/17 0701 - 09/18 0700 In: 2474.1 [P.O.:120; I.V.:2054.1; IV Piggyback:300] Out: 1300 [Urine:1300] Intake/Output this shift: Total I/O In: 183.4 [I.V.:183.4] Out: 300 [Urine:300]  General appearance: alert and no distress Neurologic: intact Heart: regular rate and rhythm Lungs: diminished breath sounds bibasilar Abdomen: maybe slightly less distended, no peritoneal signs Wound: minimal serous drainage  Lab Results:  Recent Labs  07/19/13 0420 07/20/13 0400  WBC 19.2* 18.3*  HGB 9.0* 8.9*  HCT 26.6* 26.9*  PLT 214 232   BMET:  Recent Labs  07/19/13 0420 07/20/13 0400  NA 142 140  K 3.4* 3.6  CL 100 100  CO2 27 26  GLUCOSE 118* 110*  BUN 89* 89*  CREATININE 2.96* 2.67*  CALCIUM 9.5 9.4    PT/INR: No results found for this basename: LABPROT, INR,  in the last 72 hours ABG    Component Value Date/Time   PHART 7.287* 07/15/2013 1207   HCO3 25.9* 07/15/2013 1207   TCO2 26 07/15/2013 1656   ACIDBASEDEF 1.0 07/15/2013 1207   O2SAT 93.0 07/15/2013 1207   CBG (last 3)   Recent Labs  07/19/13 1750 07/19/13 2007 07/20/13 0809  GLUCAP 83 94 145*    Assessment/Plan: S/P Procedure(s) (LRB): CORONARY ARTERY BYPASS GRAFTING (CABG) (N/A) RADIAL ARTERY HARVEST (Left) - CV-  stable- in SR on Amiodarone- continue IV amiodarone until GI function normal  RESP- bibasilar atelectasis  RENAL- creatinine down a little today- will give 2 runs of KCl(to hlep with ileus)  GI- ileus- is having small BM, but still very distended, although possibly slightly improved from yesterday  On empiric flagyl pending c diff result  Continue ambulation  CBG OK     LOS: 6 days    Michael Levy C 07/20/2013

## 2013-07-20 NOTE — Progress Notes (Signed)
[facility] Daily Rounding Note 07/20/2013, 9:56 AM  SUBJECTIVE:       No abd pain.  Still feels distended and bloated.  No nausea. Small, loose to watery stools. Some flatus. Walked once this AM.  Waiting to go for another walk now. Last oxycodone was at midnight.   OBJECTIVE:         Vital signs in last 24 hours:    Temp:  [98 F (36.7 C)-98.6 F (37 C)] 98.2 F (36.8 C) (09/18 0812) Pulse Rate:  [65-96] 89 (09/18 0900) Resp:  [13-30] 22 (09/18 0900) BP: (103-161)/(59-83) 159/73 mmHg (09/18 0900) SpO2:  [96 %-100 %] 99 % (09/18 0900) Weight:  [98.7 kg (217 lb 9.5 oz)] 98.7 kg (217 lb 9.5 oz) (09/18 0500) Last BM Date: 07/20/13 General: alert, comfortable   Heart: RRR.  Sternal wound intact Chest: RRR.  No MRG Abdomen: distended, tense, not tender.  BS hypoactive  Extremities: intact incision scar on left forarm Neuro/Psych:  Cooperative, relaxed, oriented x 3.   Intake/Output from previous day: 09/17 0701 - 09/18 0700 In: 2474.1 [P.O.:120; I.V.:2054.1; IV Piggyback:300] Out: 1300 [Urine:1300]  Intake/Output this shift: Total I/O In: 275.1 [I.V.:275.1] Out: 300 [Urine:300]  Lab Results:  Recent Labs  07/18/13 0400 07/19/13 0420 07/20/13 0400  WBC 18.1* 19.2* 18.3*  HGB 8.6* 9.0* 8.9*  HCT 25.5* 26.6* 26.9*  PLT 169 214 232   BMET  Recent Labs  07/18/13 0400 07/19/13 0420 07/20/13 0400  NA 140 142 140  K 3.5 3.4* 3.6  CL 100 100 100  CO2 26 27 26   GLUCOSE 123* 118* 110*  BUN 75* 89* 89*  CREATININE 2.64* 2.96* 2.67*  CALCIUM 9.0 9.5 9.4   LFT  Recent Labs  07/19/13 0420  PROT 6.9  ALBUMIN 2.6*  AST 27  ALT 23  ALKPHOS 120*  BILITOT 0.3   PT/INR No results found for this basename: LABPROT, INR,  in the last 72 hours Hepatitis Panel No results found for this basename: HEPBSAG, HCVAB, HEPAIGM, HEPBIGM,  in the last 72 hours  Studies/Results: US Abdomen Complete 07/20/2013   FINDINGS: Gallbladder  Dependent mid level echoes consistent  with sludge. No wall thickening or focal tenderness.  Common bile duct  Diameter: 4 mm  Liver  No focal lesion identified. Within normal limits in parenchymal echogenicity. Trace perihepatic fluid.  IVC  No abnormality where seen.  Pancreas  Unable to visualize.  Spleen  7 cm. No focal abnormality.  Right Kidney  Length: 11.5 cm.  1.4 cm cyst from the lower pole.  Left Kidney  Length: 11.5 cm lobulated contour secondary to multiple anechoic lesions, most consistent with cysts. The 2 largest, resolved lesions are 2.5 cm in the upper pole and 2.1 cm in the lower pole.  Abdominal aorta  Obscured by bowel gas.  IMPRESSION: 1. Trace ascites. 2. Gallbladder sludge. 3. Bilateral renal cysts.   Electronically Signed   By: Jorje Guild   On: 07/20/2013 01:27  Dg Abd Portable 1v 07/20/2013   CLINICAL DATA:  Followup ileus  EXAM: PORTABLE ABDOMEN - 1 VIEW  COMPARISON:  Portable chest x-ray of 07/18/2013  FINDINGS: There is slight gaseous distention of large and small bowel most consistent with minimal ileus. No definite obstruction is seen. No opaque calculi are noted.  IMPRESSION: Minimal ileus.   Electronically Signed   By: Ivar Drape M.D.   On: 07/20/2013 08:21   Dg Abd Portable 1v 07/19/2013   FINDINGS: There  is gaseous distension of the colon which is not pathologically dilated. No clear small bowel dilatation. Gas in the sigmoid colon and rectum. No pathologic calcifications.  IMPRESSION: Gas distention of the colon. No evidence of obstruction.   Electronically Signed   By: Suzy Bouchard M.D.   On: 07/19/2013 08:18      ASSESMENT: *  Ileus with abdominal distention and pain. Rule out ischemic insult to colon due to peri-operative hypoperfusion. Rule out post op/narcotic induced ileus. On Dulcolax , colace, po.  *  c diff negative. *  GB sludge, trace ascites. Alk phos elevated (new finding) but transaminases o/w normal.  Source of alk phos may be skeletal from recent sternotomy.  * S/P CABG 07/15/2011.  Preop severe 3 vessel disease with progressive angina.  *  Hypokalemia corrected. Mag and phos levels normal or elevated in last 3 days.  *  Acute on chronic stage 3 kidney disease due to peri op renal hypoperfusion/ATN per Dr Mercy Moore. Renal cysts on ultrasound. Non-oliguric.  *  Post-op anemia. MCV normocytic.  * Thrombocytopenia, post-op. Resolved.    PLAN: *  Stop the IV Flagyl. Clear diet.  Mobilize pt and try to limit narcotics (though not using abundance of these).    LOS: 6 days   Azucena Freed  07/20/2013, 9:56 AM Pager: [phone]  GI ATTENDING  Interval history, laboratories, and x-rays personally reviewed. Patient personally seen and examined. Agree with H&P as above. Clinically stable. Has been walking. Nurse reports liquid stools today. Clostridium difficile negative. KUB shows moderate colonic ileus. Exam is about the same or slightly better. Potassium improved. On chronic with diet. At this point, continue with current measures including increased activity, potassium greater than or equal 4, points of narcotics, and liquid diet only at this point. Recheck electrolytes and KUB in a.m. We will continue to follow.  Docia Chuck. Geri Seminole., M.D. [facility] Division of Gastroenterology

## 2013-07-20 NOTE — Progress Notes (Signed)
Patient ID: Michael Levy, male   DOB: April 23, 1966, 47 y.o.   MRN: [id number] EVENING ROUNDS NOTE :     [address]       [place] [address]             [phone]                 6 Days Post-Op Procedure(s) (LRB): CORONARY ARTERY BYPASS GRAFTING (CABG) (N/A) RADIAL ARTERY HARVEST (Left)  Total Length of Stay:  LOS: 6 days  BP 141/67  Pulse 76  Temp(Src) 98.1 F (36.7 C) (Oral)  Resp 23  Ht 5\' 9"  (1.753 m)  Wt 217 lb 9.5 oz (98.7 kg)  BMI 32.12 kg/m2  SpO2 90%  .Intake/Output     09/17 0701 - 09/18 0700 09/18 0701 - 09/19 0700   P.O. 120 320   I.V. (mL/kg) 2054.1 (20.8) 791.9 (8)   IV Piggyback 300 100   Total Intake(mL/kg) 2474.1 (25.1) 1211.9 (12.3)   Urine (mL/kg/hr) 1300 (0.5) 775 (0.7)   Stool     Total Output 1300 775   Net +1174.1 +436.9        Urine Occurrence 1 x    Stool Occurrence  1 x     . sodium chloride 20 mL/hr (07/15/13 0800)  . sodium chloride 75 mL (07/20/13 1551)  . sodium chloride 20 mL/hr (07/15/13 0800)  . sodium chloride    . sodium chloride 20 mL/hr at 07/18/13 0900  . amiodarone (NEXTERONE PREMIX) 360 mg/200 mL dextrose 30 mg/hr (07/20/13 1551)  . dextrose 5 % and 0.45% NaCl    . DOPamine Stopped (07/17/13 1857)  . nitroGLYCERIN Stopped (07/15/13 1400)  . phenylephrine (NEO-SYNEPHRINE) Adult infusion Stopped (07/14/13 2200)     Lab Results  Component Value Date   WBC 18.3* 07/20/2013   HGB 8.9* 07/20/2013   HCT 26.9* 07/20/2013   PLT 232 07/20/2013   GLUCOSE 110* 07/20/2013   ALT 23 07/19/2013   AST 27 07/19/2013   NA 140 07/20/2013   K 3.6 07/20/2013   CL 100 07/20/2013   CREATININE 2.67* 07/20/2013   BUN 89* 07/20/2013   CO2 26 07/20/2013   INR 1.36 07/14/2013   HGBA1C 5.8* 07/12/2013   Cr stabilized improving  Grace Isaac MD  Beeper [phone] Office [phone] 07/20/2013 6:10 PM

## 2013-07-20 NOTE — Progress Notes (Signed)
[facility] Progress Note    Assessment/ Plan:   1. CAD with 3 vessel disease s/p CABG day 4 - Hemodynamically stable and extubated maintaining sats on [place] 3 L - Management per primary team  2. Acute on chronic kidney disease Stage 3 - Baseline cr around 1.8  - likley acute increase due to renal hypoperfusion after CABG 9/12. Initially on pressors, currently stable off pressors.  - Cr improved today to 2.67 from 2.96, pressures have been more stable and the patient was transitioned to PO lasix yesterday-acute increase in Cr most likely related to low BP leading to decreased perfusion - Continue to monitor chemistries - continue diuresis with PO lasix 80 mg daily  3. Hypokalemia: likely related to diuresis with lasix - improved, will continue to monitor  4. Hypertension: improved pressures over last 24 hours, with minimal low BPs - controlled on Metoprolol and Imdur   5. Anemia - stable, continue to monitor with CBC - patient slightly anemic when admitted, consider iron studies in the future   6. Metabolic Bone Disease: Consider checking PTH with CKD3.    Subjective:   Doing ok this morning. Continues to have little appetite. No BMs since last night. GI saw him and stated most likely related to postop/narcotic ileus. Continues to have good UOP.   Objective:   BP 126/66  Pulse 81  Temp(Src) 98.6 F (37 C) (Oral)  Resp 17  Ht 5\' 9"  (1.753 m)  Wt 217 lb 9.5 oz (98.7 kg)  BMI 32.12 kg/m2  SpO2 98%  Intake/Output Summary (Last 24 hours) at 07/20/13 0729 Last data filed at 07/20/13 0600  Gross per 24 hour  Intake 2474.1 ml  Output   1300 ml  Net 1174.1 ml   Weight change: 1 lb 12.2 oz (0.8 kg)  Physical Exam: Gen: NAD, laying comfortably in bed CVS: rrr, no mrg appreciated, sternotomy scar well healing Resp: improved bibasilar crackles, otherwise clear Abd: minimal tenderness in bilateral lower quadrants, distended, tense Ext: 1+ LE Edema  BL  Imaging:  CXR 9/14 IMPRESSION Mildly increased atelectasis with possible small effusion  Renal US 07/16/2013 IMPRESSION:  1. Bilateral renal cysts. No hydronephrosis or diagnostic renal  calculus. Decompressed urinary bladder by Foley catheter.  Dg Chest Port 1 View  07/18/2013  IMPRESSION: Stable with infrahilar atelectasis.    Dg Abd Portable 1v  07/19/2013   IMPRESSION: Gas distention of the colon. No evidence of obstruction.    US Abdomen Complete  07/20/2013  IMPRESSION: 1. Trace ascites. 2. Gallbladder sludge. 3. Bilateral renal cysts.      Labs: BMET  Recent Labs Lab 07/16/13 0343 07/16/13 1800 07/17/13 0330 07/17/13 1400 07/18/13 0400 07/19/13 0420 07/20/13 0400  NA 141 141 139 137 140 142 140  K 5.3* 4.8 4.6 3.8 3.5 3.4* 3.6  CL 104 102 101 99 100 100 100  CO2 25 24 25 25 26 27 26   GLUCOSE 110* 109* 118* 166* 123* 118* 110*  BUN 48* 61* 68* 72* 75* 89* 89*  CREATININE 3.12* 3.35* 3.12* 2.88* 2.64* 2.96* 2.67*  CALCIUM 8.8 8.8 8.5 9.2 9.0 9.5 9.4  PHOS  --   --  5.1*  --   --   --   --    CBC  Recent Labs Lab 07/17/13 0330 07/18/13 0400 07/19/13 0420 07/20/13 0400  WBC 15.5* 18.1* 19.2* 18.3*  HGB 8.6* 8.6* 9.0* 8.9*  HCT 25.6* 25.5* 26.6* 26.9*  MCV 89.8 88.5 89.0 89.7  PLT 125*  169 214 232    Medications:    . aspirin EC  325 mg Oral Daily   Or  . aspirin  324 mg Per Tube Daily  . atorvastatin  40 mg Oral q1800  . bisacodyl  10 mg Oral Daily   Or  . bisacodyl  10 mg Rectal Daily  . docusate sodium  200 mg Oral Daily  . enoxaparin (LOVENOX) injection  30 mg Subcutaneous Q24H  . feeding supplement (NEPRO CARB STEADY)  237 mL Oral BID BM  . furosemide  80 mg Oral Daily  . insulin aspart  0-15 Units Subcutaneous TID WC  . insulin detemir  10 Units Subcutaneous Daily  . isosorbide mononitrate  30 mg Oral Daily  . levalbuterol  0.63 mg Nebulization TID  . metoprolol tartrate  25 mg Oral BID   Or  . metoprolol tartrate  25 mg Per  Tube BID  . metronidazole  500 mg Intravenous Q8H  . pantoprazole  40 mg Oral Daily  . sodium chloride  3 mL Intravenous Q12H      Leone Haven, MD  Family Practice PGY-2 07/20/2013, 7:29 AM  I have seen and examined this patient and agree with plan per Dr Biagio Quint.  UO good.  Scr trending down.  Cont with IV fluids as now NPO.  Recheck labs in AM. Eriberto Felch T,MD 07/20/2013 9:21 AM

## 2013-07-21 ENCOUNTER — Inpatient Hospital Stay (HOSPITAL_COMMUNITY): Payer: BC Managed Care – PPO

## 2013-07-21 DIAGNOSIS — I2510 Atherosclerotic heart disease of native coronary artery without angina pectoris: Principal | ICD-10-CM

## 2013-07-21 LAB — COMPREHENSIVE METABOLIC PANEL
AST: 21 U/L (ref 0–37)
Albumin: 2.3 g/dL — ABNORMAL LOW (ref 3.5–5.2)
Alkaline Phosphatase: 90 U/L (ref 39–117)
Chloride: 104 mEq/L (ref 96–112)
Potassium: 3.4 mEq/L — ABNORMAL LOW (ref 3.5–5.1)
Total Bilirubin: 0.3 mg/dL (ref 0.3–1.2)

## 2013-07-21 LAB — GLUCOSE, CAPILLARY: Glucose-Capillary: 101 mg/dL — ABNORMAL HIGH (ref 70–99)

## 2013-07-21 LAB — CBC
MCH: 29.6 pg (ref 26.0–34.0)
MCV: 89.6 fL (ref 78.0–100.0)
Platelets: 246 10*3/uL (ref 150–400)
RBC: 3.18 MIL/uL — ABNORMAL LOW (ref 4.22–5.81)

## 2013-07-21 MED ORDER — POTASSIUM CHLORIDE ER 10 MEQ PO TBCR
30.0000 meq | EXTENDED_RELEASE_TABLET | Freq: Once | ORAL | Status: AC
  Administered 2013-07-21: 30 meq via ORAL
  Filled 2013-07-21: qty 3

## 2013-07-21 MED ORDER — SODIUM CHLORIDE 0.9 % IJ SOLN: 3.0000 mL | INTRAMUSCULAR | Status: DC | PRN

## 2013-07-21 MED ORDER — POTASSIUM CHLORIDE 10 MEQ/100ML IV SOLN: 10.0000 meq | INTRAVENOUS | Status: DC

## 2013-07-21 MED ORDER — MOVING RIGHT ALONG BOOK
Freq: Once | Status: DC
  Filled 2013-07-21: qty 1

## 2013-07-21 MED ORDER — SODIUM CHLORIDE 0.9 % IJ SOLN
3.0000 mL | Freq: Two times a day (BID) | INTRAMUSCULAR | Status: DC
  Administered 2013-07-21 – 2013-07-23 (×4): 3 mL via INTRAVENOUS

## 2013-07-21 MED ORDER — SODIUM CHLORIDE 0.9 % IV SOLN: 250.0000 mL | INTRAVENOUS | Status: DC | PRN

## 2013-07-21 MED ORDER — OXYCODONE HCL 5 MG PO TABS
5.0000 mg | ORAL_TABLET | ORAL | Status: DC | PRN
  Administered 2013-07-21 – 2013-07-22 (×3): 5 mg via ORAL
  Filled 2013-07-21 (×3): qty 1

## 2013-07-21 MED ORDER — AMIODARONE HCL 200 MG PO TABS
400.0000 mg | ORAL_TABLET | Freq: Two times a day (BID) | ORAL | Status: DC
  Administered 2013-07-21 – 2013-07-24 (×7): 400 mg via ORAL
  Filled 2013-07-21 (×8): qty 2

## 2013-07-21 MED ORDER — AMLODIPINE BESYLATE 2.5 MG PO TABS
2.5000 mg | ORAL_TABLET | Freq: Every day | ORAL | Status: DC
  Administered 2013-07-21 – 2013-07-24 (×4): 2.5 mg via ORAL
  Filled 2013-07-21 (×4): qty 1

## 2013-07-21 MED ORDER — ENOXAPARIN SODIUM 40 MG/0.4ML ~~LOC~~ SOLN
40.0000 mg | SUBCUTANEOUS | Status: DC
  Administered 2013-07-21 – 2013-07-23 (×3): 40 mg via SUBCUTANEOUS
  Filled 2013-07-21 (×4): qty 0.4

## 2013-07-21 MED ORDER — GUAIFENESIN-DM 100-10 MG/5ML PO SYRP: 15.0000 mL | ORAL_SOLUTION | ORAL | Status: DC | PRN

## 2013-07-21 MED ORDER — ZOLPIDEM TARTRATE 5 MG PO TABS: 5.0000 mg | ORAL_TABLET | Freq: Every evening | ORAL | Status: DC | PRN

## 2013-07-21 MED ORDER — ALPRAZOLAM 0.25 MG PO TABS
0.2500 mg | ORAL_TABLET | Freq: Four times a day (QID) | ORAL | Status: DC | PRN
  Administered 2013-07-23 – 2013-07-24 (×2): 0.25 mg via ORAL
  Filled 2013-07-21 (×2): qty 1

## 2013-07-21 MED ORDER — ACETAMINOPHEN 325 MG PO TABS
650.0000 mg | ORAL_TABLET | ORAL | Status: DC | PRN
  Administered 2013-07-21: 650 mg via ORAL
  Filled 2013-07-21: qty 2

## 2013-07-21 NOTE — Progress Notes (Signed)
[facility] Progress Note    Since last GI note: Feels a bit less bloated.  Still with loose stools, flatus. Not much of an appetite, the liquid diet appeals more than solids still.  Objective: Vital signs in last 24 hours: Temp:  [97.9 F (36.6 C)-98.1 F (36.7 C)] 97.9 F (36.6 C) (09/19 0740) Pulse Rate:  [64-101] 76 (09/19 0700) Resp:  [13-31] 20 (09/19 0700) BP: (119-180)/(58-80) 153/76 mmHg (09/19 0700) SpO2:  [90 %-99 %] 97 % (09/19 0700) Weight:  [216 lb 14.9 oz (98.4 kg)] 216 lb 14.9 oz (98.4 kg) (09/19 0500) Last BM Date: 07/20/13 General: alert and oriented times 3 Heart: regular rate and rythm Abdomen: tense, distended (first time I've seen him) + normal bowel sounds   Lab Results:  Recent Labs  07/19/13 0420 07/20/13 0400 07/21/13 0310  WBC 19.2* 18.3* 15.9*  HGB 9.0* 8.9* 9.4*  PLT 214 232 246  MCV 89.0 89.7 89.6    Recent Labs  07/19/13 0420 07/20/13 0400 07/21/13 0310  NA 142 140 143  K 3.4* 3.6 3.4*  CL 100 100 104  CO2 27 26 26   GLUCOSE 118* 110* 105*  BUN 89* 89* 82*  CREATININE 2.96* 2.67* 2.20*  CALCIUM 9.5 9.4 9.0    Recent Labs  07/19/13 0420 07/21/13 0310  PROT 6.9 6.5  ALBUMIN 2.6* 2.3*  AST 27 21  ALT 23 17  ALKPHOS 120* 90  BILITOT 0.3 0.3     Medications: Scheduled Meds: . aspirin EC  325 mg Oral Daily   Or  . aspirin  324 mg Per Tube Daily  . atorvastatin  40 mg Oral q1800  . bisacodyl  10 mg Oral Daily   Or  . bisacodyl  10 mg Rectal Daily  . docusate sodium  200 mg Oral Daily  . enoxaparin (LOVENOX) injection  30 mg Subcutaneous Q24H  . feeding supplement (NEPRO CARB STEADY)  237 mL Oral BID BM  . furosemide  80 mg Oral Daily  . insulin aspart  0-15 Units Subcutaneous TID WC  . insulin detemir  10 Units Subcutaneous Daily  . isosorbide mononitrate  30 mg Oral Daily  . metoprolol tartrate  25 mg Oral BID   Or  . metoprolol tartrate  25 mg Per Tube BID  . pantoprazole  40 mg Oral Daily  .  sodium chloride  3 mL Intravenous Q12H   Continuous Infusions: . sodium chloride 20 mL/hr (07/15/13 0800)  . sodium chloride 75 mL/hr at 07/21/13 0400  . sodium chloride 20 mL/hr (07/15/13 0800)  . sodium chloride    . sodium chloride 20 mL/hr at 07/18/13 0900  . amiodarone (NEXTERONE PREMIX) 360 mg/200 mL dextrose 30 mg/hr (07/21/13 0400)  . dextrose 5 % and 0.45% NaCl    . DOPamine Stopped (07/17/13 1857)  . nitroGLYCERIN Stopped (07/15/13 1400)  . phenylephrine (NEO-SYNEPHRINE) Adult infusion Stopped (07/14/13 2200)   PRN Meds:.dextrose, levalbuterol, metoprolol, ondansetron (ZOFRAN) IV, oxyCODONE, sodium chloride  KUB this AM: no real change compared to yesterday, dilated air filled colon  Assessment/Plan: 47 y.o. male with post op ileus  Seems to be slowing improving. Should replete K, encourage ambulation, limit narcotic pain meds as much as possible.  He took oxycodone last night, I wrote for tylenol 650 which should be offered first for her pain.  Will continue on clears today, recheck KUB.    Milus Banister, MD  07/21/2013, 8:20 AM [facility] Pager [phone]

## 2013-07-21 NOTE — Progress Notes (Signed)
[facility] Progress Note    Assessment/ Plan:   1. 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 Acute on chronic kidney disease Stage 3 - Baseline cr around 1.8  - likley acute increase due to renal hypoperfusion after CABG 9/12. Initially on pressors, currently stable off pressors.  - Cr improved today to 2.2, pressures have been more stable and the patient was transitioned to PO lasix yesterday-acute increase in Cr most likely related to low BP leading to decreased perfusion - still appears mildly volume overloaded with 1+ edema in LE - Continue to monitor chemistries - continue diuresis with PO lasix 80 mg daily  3. Hypokalemia: likely related to diuresis with lasix - low at 3.4, will need to replete this morning with KCl 10 mEq x2  4. Hypertension: improved pressures over last 24 hours, with some intermittent high BPs - controlled on Metoprolol and Imdur   5. Anemia - stable, continue to monitor with CBC - patient slightly anemic when admitted, consider iron studies in the future   6. Metabolic Bone Disease: Consider checking PTH with CKD3.    Subjective:   Continues to do ok. Had several loose mucousy BMs last night. Continues to have good UOP.    Objective:   BP 153/76  Pulse 76  Temp(Src) 97.9 F (36.6 C) (Oral)  Resp 20  Ht 5\' 9"  (1.753 m)  Wt 216 lb 14.9 oz (98.4 kg)  BMI 32.02 kg/m2  SpO2 97%  Intake/Output Summary (Last 24 hours) at 07/21/13 0801 Last data filed at 07/21/13 0700  Gross per 24 hour  Intake 2889.1 ml  Output   1027 ml  Net 1862.1 ml   Weight change: -10.6 oz (-0.3 kg)  Physical Exam: Gen: NAD, laying comfortably in bed CVS: rrr, no mrg appreciated, sternotomy scar well healing Resp: minimal bibasilar crackles, otherwise clear Abd: less tense, less distended, non-tender Ext: 1+ LE Edema BL  Imaging:  CXR  9/14 IMPRESSION Mildly increased atelectasis with possible small effusion  Renal US 07/16/2013 IMPRESSION:  1. Bilateral renal cysts. No hydronephrosis or diagnostic renal  calculus. Decompressed urinary bladder by Foley catheter.  Dg Chest Port 1 View  07/18/2013  IMPRESSION: Stable with infrahilar atelectasis.    Dg Abd Portable 1v  07/19/2013   IMPRESSION: Gas distention of the colon. No evidence of obstruction.    US Abdomen Complete  07/20/2013  IMPRESSION: 1. Trace ascites. 2. Gallbladder sludge. 3. Bilateral renal cysts.      Labs: BMET  Recent Labs Lab 07/16/13 1800 07/17/13 0330 07/17/13 1400 07/18/13 0400 07/19/13 0420 07/20/13 0400 07/21/13 0310  NA 141 139 137 140 142 140 143  K 4.8 4.6 3.8 3.5 3.4* 3.6 3.4*  CL 102 101 99 100 100 100 104  CO2 24 25 25 26 27 26 26   GLUCOSE 109* 118* 166* 123* 118* 110* 105*  BUN 61* 68* 72* 75* 89* 89* 82*  CREATININE 3.35* 3.12* 2.88* 2.64* 2.96* 2.67* 2.20*  CALCIUM 8.8 8.5 9.2 9.0 9.5 9.4 9.0  PHOS  --  5.1*  --   --   --   --   --    CBC  Recent Labs Lab 07/18/13 0400 07/19/13 0420 07/20/13 0400 07/21/13 0310  WBC 18.1* 19.2* 18.3* 15.9*  HGB 8.6* 9.0* 8.9* 9.4*  HCT 25.5* 26.6* 26.9* 28.5*  MCV 88.5 89.0 89.7 89.6  PLT  169 214 232 246    Medications:    . aspirin EC  325 mg Oral Daily   Or  . aspirin  324 mg Per Tube Daily  . atorvastatin  40 mg Oral q1800  . bisacodyl  10 mg Oral Daily   Or  . bisacodyl  10 mg Rectal Daily  . docusate sodium  200 mg Oral Daily  . enoxaparin (LOVENOX) injection  30 mg Subcutaneous Q24H  . feeding supplement (NEPRO CARB STEADY)  237 mL Oral BID BM  . furosemide  80 mg Oral Daily  . insulin aspart  0-15 Units Subcutaneous TID WC  . insulin detemir  10 Units Subcutaneous Daily  . isosorbide mononitrate  30 mg Oral Daily  . metoprolol tartrate  25 mg Oral BID   Or  . metoprolol tartrate  25 mg Per Tube BID  . pantoprazole  40 mg Oral Daily  . sodium chloride  3  mL Intravenous Q12H      Leone Haven, MD  Nephrology Service PGY-2 07/21/2013, 8:01 AM  I have seen and examined this patient and agree with plan per Dr Caryl Bis. UO good and renal fx improving. BP increasing.  He was on lisinopril as outpt but can't use now.  Will start amlodipine, low dose. Decrease IV fluids to 50cc/hr. Jolynne Spurgin T,MD 07/21/2013 9:08 AM

## 2013-07-21 NOTE — Progress Notes (Signed)
CARDIAC REHAB PHASE I   PRE:  Rate/Rhythm: 76SR  BP:  Supine: 140/84  Sitting:   Standing:    SaO2: 94%RA  MODE:  Ambulation: 150 ft   POST:  Rate/Rhythm: 87SR  BP:  Supine:   Sitting: 140/80  Standing:    SaO2: 98%RA 1430-1510 Pt c/o right knee pain. Knee is swollen and he cannot bear weight. Walked 150 ft on RA with gait belt use, rolling walker and asst x 2 encouraging pt not to put pressure on arms. Reviewed sternal precautions x2.  To recliner after walk with call light. Will keep as asst x 2 until knee less sore.   Graylon Good, RN BSN  07/21/2013 3:05 PM

## 2013-07-21 NOTE — Progress Notes (Signed)
7 Days Post-Op Procedure(s) (LRB): CORONARY ARTERY BYPASS GRAFTING (CABG) (N/A) RADIAL ARTERY HARVEST (Left) Subjective: Feels better Still doesn't have much of an appetite  No abdominal pain Multiple loose BM overnight  Objective: Vital signs in last 24 hours: Temp:  [97.9 F (36.6 C)-98.1 F (36.7 C)] 97.9 F (36.6 C) (09/19 0740) Pulse Rate:  [64-101] 86 (09/19 0900) Cardiac Rhythm:  [-] Atrial fibrillation (09/19 0800) Resp:  [13-32] 16 (09/19 0900) BP: (119-180)/(58-98) 154/75 mmHg (09/19 0900) SpO2:  [90 %-99 %] 96 % (09/19 0900) Weight:  [216 lb 14.9 oz (98.4 kg)] 216 lb 14.9 oz (98.4 kg) (09/19 0500)  Hemodynamic parameters for last 24 hours:    Intake/Output from previous day: 09/18 0701 - 09/19 0700 In: 3072.5 [P.O.:680; I.V.:2292.5; IV Piggyback:100] Out: 1327 [Urine:1325; Stool:2] Intake/Output this shift: Total I/O In: 423.4 [P.O.:240; I.V.:183.4] Out: 1 [Stool:1]  General appearance: alert and no distress Neurologic: intact Heart: regular rate and rhythm Lungs: diminished breath sounds bibasilar Abdomen: less distended, +BS  Lab Results:  Recent Labs  07/20/13 0400 07/21/13 0310  WBC 18.3* 15.9*  HGB 8.9* 9.4*  HCT 26.9* 28.5*  PLT 232 246   BMET:  Recent Labs  07/20/13 0400 07/21/13 0310  NA 140 143  K 3.6 3.4*  CL 100 104  CO2 26 26  GLUCOSE 110* 105*  BUN 89* 82*  CREATININE 2.67* 2.20*  CALCIUM 9.4 9.0    PT/INR: No results found for this basename: LABPROT, INR,  in the last 72 hours ABG    Component Value Date/Time   PHART 7.287* 07/15/2013 1207   HCO3 25.9* 07/15/2013 1207   TCO2 26 07/15/2013 1656   ACIDBASEDEF 1.0 07/15/2013 1207   O2SAT 93.0 07/15/2013 1207   CBG (last 3)   Recent Labs  07/20/13 1742 07/20/13 2204 07/21/13 0737  GLUCAP 106* 85 101*    Assessment/Plan: S/P Procedure(s) (LRB): CORONARY ARTERY BYPASS GRAFTING (CABG) (N/A) RADIAL ARTERY HARVEST (Left) Plan for transfer to step-down: see transfer  orders CV- in SR on amiodarone gtt, will convert to PO  RESP_ continue IS  RENAL- creatinine improving, supplement K  GI- ileus seems to be resolving, he is still distended but markedly improved over past 48 hours  c diff negative  ENDO- CBG OK- dc levemir  LOS: 7 days    HENDRICKSON,STEVEN C 07/21/2013

## 2013-07-22 ENCOUNTER — Inpatient Hospital Stay (HOSPITAL_COMMUNITY): Payer: BC Managed Care – PPO

## 2013-07-22 LAB — CBC
Hemoglobin: 8 g/dL — ABNORMAL LOW (ref 13.0–17.0)
MCH: 29.6 pg (ref 26.0–34.0)
MCV: 89.6 fL (ref 78.0–100.0)
RBC: 2.7 MIL/uL — ABNORMAL LOW (ref 4.22–5.81)

## 2013-07-22 LAB — BASIC METABOLIC PANEL
CO2: 24 mEq/L (ref 19–32)
Glucose, Bld: 95 mg/dL (ref 70–99)
Potassium: 3.5 mEq/L (ref 3.5–5.1)
Sodium: 140 mEq/L (ref 135–145)

## 2013-07-22 LAB — GLUCOSE, CAPILLARY
Glucose-Capillary: 132 mg/dL — ABNORMAL HIGH (ref 70–99)
Glucose-Capillary: 153 mg/dL — ABNORMAL HIGH (ref 70–99)
Glucose-Capillary: 205 mg/dL — ABNORMAL HIGH (ref 70–99)

## 2013-07-22 MED ORDER — POTASSIUM CHLORIDE CRYS ER 20 MEQ PO TBCR
20.0000 meq | EXTENDED_RELEASE_TABLET | Freq: Once | ORAL | Status: AC
  Administered 2013-07-22: 20 meq via ORAL
  Filled 2013-07-22: qty 1

## 2013-07-22 MED ORDER — COLCHICINE 0.6 MG PO TABS
0.6000 mg | ORAL_TABLET | Freq: Every day | ORAL | Status: DC
  Administered 2013-07-23 – 2013-07-24 (×2): 0.6 mg via ORAL
  Filled 2013-07-22 (×2): qty 1

## 2013-07-22 MED ORDER — OXYCODONE HCL 5 MG PO TABS
5.0000 mg | ORAL_TABLET | ORAL | Status: DC | PRN
  Administered 2013-07-22 – 2013-07-24 (×5): 5 mg via ORAL
  Filled 2013-07-22 (×5): qty 1

## 2013-07-22 MED ORDER — COLCHICINE 0.6 MG PO TABS
0.6000 mg | ORAL_TABLET | Freq: Two times a day (BID) | ORAL | Status: AC
  Administered 2013-07-22 (×2): 0.6 mg via ORAL
  Filled 2013-07-22 (×2): qty 1

## 2013-07-22 NOTE — Progress Notes (Signed)
[facility] Progress Note    Since last GI note: Abd feels "fine, normal" though it is still distended on exam.  + loose stools with a lot of flatus.  HE is on clear liquids and does not feel like advancing to solid foods however the liquids go down without N/V or abd pains  Objective: Vital signs in last 24 hours: Temp:  [97.9 F (36.6 C)-98.5 F (36.9 C)] 98.5 F (36.9 C) (09/20 0452) Pulse Rate:  [75-86] 77 (09/20 0452) Resp:  [18-22] 18 (09/20 0452) BP: (122-158)/(64-71) 137/67 mmHg (09/20 0452) SpO2:  [97 %-98 %] 97 % (09/20 0452) Weight:  [214 lb 11.7 oz (97.4 kg)] 214 lb 11.7 oz (97.4 kg) (09/20 0452) Last BM Date: 07/22/13 General: alert and oriented times 3 Heart: regular rate and rythm Abdomen: soft, non-tender, + distended, normal bowel sounds   Lab Results:  Recent Labs  07/20/13 0400 07/21/13 0310 07/22/13 0546  WBC 18.3* 15.9* 17.9*  HGB 8.9* 9.4* 8.0*  PLT 232 246 298  MCV 89.7 89.6 89.6    Recent Labs  07/20/13 0400 07/21/13 0310 07/22/13 0546  NA 140 143 140  K 3.6 3.4* 3.5  CL 100 104 102  CO2 26 26 24   GLUCOSE 110* 105* 95  BUN 89* 82* 74*  CREATININE 2.67* 2.20* 2.29*  CALCIUM 9.4 9.0 8.9    Recent Labs  07/21/13 0310  PROT 6.5  ALBUMIN 2.3*  AST 21  ALT 17  ALKPHOS 90  BILITOT 0.3    Studies/Results: Dg Chest 2 View  07/22/2013   CLINICAL DATA:  Coronary bypass grafting.  EXAM: CHEST  2 VIEW  COMPARISON:  07/18/2013  FINDINGS: Previous CABG. The right IJ venous sheath has been removed. Stable moderate cardiomegaly. Patchy bibasilar atelectasis or consolidation, improved since previous exam. Small residual left pleural effusion.  IMPRESSION: 1. Bibasilar atelectasis or infiltrates, improved since previous exam. 2. Small residual left pleural effusion.   Electronically Signed   By: Arne Cleveland M.D.   On: 07/22/2013 06:54   Dg Abd Portable 1v  07/21/2013   CLINICAL DATA:  Colonic distention.  EXAM: PORTABLE ABDOMEN  - 1 VIEW  COMPARISON:  07/20/2013.  FINDINGS: Right colon with appearance suggesting linear gas collection within stool within the right colon. This limits evaluation for possibility of pneumatosis. If this were of concern then CT imaging may be considered.  Gas distended transverse colon measuring up to 9.2 cm. Paucity of distal colonic gas.  No gas distended small bowel loops.  Overall appearance may represent ileus. Distal obstructing lesion not entirely excluded.  The possibility of free intraperitoneal air cannot be assessed on a supine view.  IMPRESSION: No significant change with gas pattern which may represent colonic ileus. Please see above discussion.   Electronically Signed   By: Chauncey Cruel   On: 07/21/2013 08:14     Medications: Scheduled Meds: . amiodarone  400 mg Oral BID  . amLODipine  2.5 mg Oral Daily  . aspirin EC  325 mg Oral Daily   Or  . aspirin  324 mg Per Tube Daily  . atorvastatin  40 mg Oral q1800  . bisacodyl  10 mg Oral Daily   Or  . bisacodyl  10 mg Rectal Daily  . colchicine  0.6 mg Oral BID  . [START ON 07/23/2013] colchicine  0.6 mg Oral Daily  . docusate sodium  200 mg Oral Daily  . enoxaparin (LOVENOX) injection  40 mg Subcutaneous Q24H  . feeding  supplement (NEPRO CARB STEADY)  237 mL Oral BID BM  . furosemide  80 mg Oral Daily  . insulin aspart  0-15 Units Subcutaneous TID WC  . isosorbide mononitrate  30 mg Oral Daily  . metoprolol tartrate  25 mg Oral BID   Or  . metoprolol tartrate  25 mg Per Tube BID  . moving right along book   Does not apply Once  . pantoprazole  40 mg Oral Daily  . potassium chloride  20 mEq Oral Once  . sodium chloride  3 mL Intravenous Q12H   Continuous Infusions: . dextrose 5 % and 0.45% NaCl     PRN Meds:.sodium chloride, acetaminophen, ALPRAZolam, guaiFENesin-dextromethorphan, levalbuterol, ondansetron (ZOFRAN) IV, oxyCODONE, sodium chloride, zolpidem    Assessment/Plan: 47 y.o. male with post op, mild ileus  He  is tolerating liquids without n/v or abd pain, passing gas and loose stool.  He is distended but says this is normal for him.  Ileus is mild and slowly resolving.  Can advance diet as he wishes but would not push solids until he is ready.  Keep electrolytes in normal range.  Ambulate as much as he can tolerate (probably the best way to help resume GI function).  Limit narcotics as best as possible.    Michael Banister, MD  07/22/2013, 10:00 AM [facility] Pager [phone]

## 2013-07-22 NOTE — Progress Notes (Addendum)
      [address]       [place] [address]             [phone]        8 Days Post-Op Procedure(s) (LRB): CORONARY ARTERY BYPASS GRAFTING (CABG) (N/A) RADIAL ARTERY HARVEST (Left)  Subjective: Patient with complaints of right knee pain, that is limiting his mobility. He still does not have much appetite. He denies abdominal pain, nausea, or emesis.  Objective: Vital signs in last 24 hours: Temp:  [97.9 F (36.6 C)-98.5 F (36.9 C)] 98.5 F (36.9 C) (09/20 0452) Pulse Rate:  [71-86] 77 (09/20 0452) Cardiac Rhythm:  [-] Normal sinus rhythm (09/19 2008) Resp:  [16-22] 18 (09/20 0452) BP: (122-162)/(64-75) 137/67 mmHg (09/20 0452) SpO2:  [96 %-98 %] 97 % (09/20 0452) Weight:  [97.4 kg (214 lb 11.7 oz)] 97.4 kg (214 lb 11.7 oz) (09/20 0452)  Pre op weight  98 kg Current Weight  07/22/13 97.4 kg (214 lb 11.7 oz)      Intake/Output from previous day: 09/19 0701 - 09/20 0700 In: 930.2 [P.O.:480; I.V.:450.2] Out: 603 [Urine:600; Stool:3]   Physical Exam:  Cardiovascular: RRR, no murmurs, gallops, or rubs. Pulmonary: Diminished at bases bilaterally; no rales, wheezes, or rhonchi. Abdomen: Soft, non tender,distended, sporadic bowel sounds present. Extremities: Mild bilateral lower extremity edema. Right knee is swollen and warm. Wounds: Clean and dry.  No erythema or signs of infection.  Lab Results: CBC: Recent Labs  07/21/13 0310 07/22/13 0546  WBC 15.9* 17.9*  HGB 9.4* 8.0*  HCT 28.5* 24.2*  PLT 246 298   BMET:  Recent Labs  07/21/13 0310 07/22/13 0546  NA 143 140  K 3.4* 3.5  CL 104 102  CO2 26 24  GLUCOSE 105* 95  BUN 82* 74*  CREATININE 2.20* 2.29*  CALCIUM 9.0 8.9    PT/INR:  Lab Results  Component Value Date   INR 1.36 07/14/2013   INR 1.03 07/12/2013   INR 1.2* 06/28/2013   ABG:  INR: Will add last result for INR, ABG once components are confirmed Will add last 4 CBG results once components are  confirmed  Assessment/Plan:  1. CV - Previous a fib with RVR.SR this am. On Amiodarone 400 bid, Norvasc 2.5, Lopressor 25 bid,and Imdur 30 daily. 2.  Pulmonary - CXR this am shows improvement in aeration, bibasilar atelectasis, stable cardiomegaly, small left pleural effusion, and no pneumothorax. Encourage incentive spirometer and flutter valve. 3. Volume Overload - On Lasix 80 daily. 4.  Anemia which is related to surgery and CKD - H and H 8 and 24.2. 5.CKD (stage 3)-creatinine 2.29 this am (baseline is around 1.9). Per nephrology 6.GI-post op ileus is resolving. On clear liquids. Diet per gastroenterology. Avoid narcotics as much as possible. 7.Leukocytiosis-WBC slightly increased from 15.9 to 17.9. Remains afebrile. No signs of wound infection and no GU complaints. Could be related to swollen right knee-possible gout. Will give Colchicine for swollen right knee, per pharmacy recommendation as with CKD. 8.Remove EPW in am 9.Gently supplement potassium  ZIMMERMAN,DONIELLE MPA-C 07/22/2013,8:21 AM  Patient seen and examined. Agree with above. I suspect he has gout- can't use NSAID due to renal issues, will use colchicine  Ambulate as tolerated  Still volume overloaded

## 2013-07-22 NOTE — Progress Notes (Signed)
Pt ambulating in hall with wife, using RW.

## 2013-07-22 NOTE — Progress Notes (Signed)
Patient ambulated with RN using rolling walker approximately 163ft.  Patient had a slow steady gait and tolerated ambulation well.  Patient's vitals signs stable. Patient returned to bed resting. Will continue to monitor.

## 2013-07-22 NOTE — Progress Notes (Signed)
PT demonstrated verbal and hands on understanding of Flutter device. 

## 2013-07-22 NOTE — Progress Notes (Signed)
S:  CO pain Rt knee.  Appetite remains marginal O:BP 137/67  Pulse 77  Temp(Src) 98.5 F (36.9 C) (Oral)  Resp 18  Ht 5\' 9"  (1.753 m)  Wt 97.4 kg (214 lb 11.7 oz)  BMI 31.7 kg/m2  SpO2 97%  Intake/Output Summary (Last 24 hours) at 07/22/13 [id number] Last data filed at 07/21/13 2130  Gross per 24 hour  Intake  718.5 ml  Output    602 ml  Net  116.5 ml   Weight change: -1 kg (-2 lb 3.3 oz) [id number] and alert CVS:RRR Resp:Bronchial BS Lt base Abd:+ BS NT mild distension [id number] edema.  + effusion rt knee NEURO:CNI no asterixis   . amiodarone  400 mg Oral BID  . amLODipine  2.5 mg Oral Daily  . aspirin EC  325 mg Oral Daily   Or  . aspirin  324 mg Per Tube Daily  . atorvastatin  40 mg Oral q1800  . bisacodyl  10 mg Oral Daily   Or  . bisacodyl  10 mg Rectal Daily  . docusate sodium  200 mg Oral Daily  . enoxaparin (LOVENOX) injection  40 mg Subcutaneous Q24H  . feeding supplement (NEPRO CARB STEADY)  237 mL Oral BID BM  . furosemide  80 mg Oral Daily  . insulin aspart  0-15 Units Subcutaneous TID WC  . isosorbide mononitrate  30 mg Oral Daily  . metoprolol tartrate  25 mg Oral BID   Or  . metoprolol tartrate  25 mg Per Tube BID  . moving right along book   Does not apply Once  . pantoprazole  40 mg Oral Daily  . sodium chloride  3 mL Intravenous Q12H   Dg Chest 2 View  07/22/2013   CLINICAL DATA:  Coronary bypass grafting.  EXAM: CHEST  2 VIEW  COMPARISON:  07/18/2013  FINDINGS: Previous CABG. The right IJ venous sheath has been removed. Stable moderate cardiomegaly. Patchy bibasilar atelectasis or consolidation, improved since previous exam. Small residual left pleural effusion.  IMPRESSION: 1. Bibasilar atelectasis or infiltrates, improved since previous exam. 2. Small residual left pleural effusion.   Electronically Signed   By: Arne Cleveland M.D.   On: 07/22/2013 06:54   Dg Abd Portable 1v  07/21/2013   CLINICAL DATA:  Colonic distention.  EXAM: PORTABLE ABDOMEN -  1 VIEW  COMPARISON:  07/20/2013.  FINDINGS: Right colon with appearance suggesting linear gas collection within stool within the right colon. This limits evaluation for possibility of pneumatosis. If this were of concern then CT imaging may be considered.  Gas distended transverse colon measuring up to 9.2 cm. Paucity of distal colonic gas.  No gas distended small bowel loops.  Overall appearance may represent ileus. Distal obstructing lesion not entirely excluded.  The possibility of free intraperitoneal air cannot be assessed on a supine view.  IMPRESSION: No significant change with gas pattern which may represent colonic ileus. Please see above discussion.   Electronically Signed   By: Chauncey Cruel   On: 07/21/2013 08:14   BMET    Component Value Date/Time   NA 140 07/22/2013 0546   K 3.5 07/22/2013 0546   CL 102 07/22/2013 0546   CO2 24 07/22/2013 0546   GLUCOSE 95 07/22/2013 0546   BUN 74* 07/22/2013 0546   CREATININE 2.29* 07/22/2013 0546   CALCIUM 8.9 07/22/2013 0546   GFRNONAA 32* 07/22/2013 0546   GFRAA 37* 07/22/2013 0546   CBC    Component Value Date/Time  WBC 17.9* 07/22/2013 0546   RBC 2.70* 07/22/2013 0546   HGB 8.0* 07/22/2013 0546   HCT 24.2* 07/22/2013 0546   PLT 298 07/22/2013 0546   MCV 89.6 07/22/2013 0546   MCH 29.6 07/22/2013 0546   MCHC 33.1 07/22/2013 0546   RDW 14.1 07/22/2013 0546   LYMPHSABS 1.0 06/28/2013 1540   MONOABS 0.6 06/28/2013 1540   EOSABS 0.3 06/28/2013 1540   BASOSABS 0.0 06/28/2013 1540     Assessment: 1. AKI on CKD 3 sec ischemic ATN, improved but Scr slow to return to baseline 1.9 2. SP CABG 3. Rt knee pain  Plan: 1. Recheck labs in AM 2. Rec advancing diet 3. Cont with lasix   Michael Levy T

## 2013-07-22 NOTE — Progress Notes (Signed)
CARDIAC REHAB PHASE I   PRE:  Rate/Rhythm: 71 SR  BP:  Sitting: 124/58     SaO2: 96% RA  MODE:  Ambulation: 200 ft   POST:  Rate/Rhythm: 86 SR  BP:  Sitting: 152/74     SaO2: 96% RA  1:55PM-2:18PM Patient tolerated walk well.  Patient states that knee feels better with activity. Patient was left in chair with family in room.   Vita Erm, [place] 07/22/2013 2:16 PM

## 2013-07-22 NOTE — Progress Notes (Signed)
Patient has been up to the bathroom throughout the night using his walker.  His right knee is swollen making it difficult for him to walk.  Will continue to monitor.

## 2013-07-23 LAB — RENAL FUNCTION PANEL
Albumin: 2.4 g/dL — ABNORMAL LOW (ref 3.5–5.2)
Chloride: 100 mEq/L (ref 96–112)
GFR calc Af Amer: 36 mL/min — ABNORMAL LOW (ref >90)
GFR calc non Af Amer: 31 mL/min — ABNORMAL LOW (ref >90)
Potassium: 3.3 mEq/L — ABNORMAL LOW (ref 3.5–5.1)
Sodium: 139 mEq/L (ref 135–145)

## 2013-07-23 LAB — GLUCOSE, CAPILLARY: Glucose-Capillary: 98 mg/dL (ref 70–99)

## 2013-07-23 MED ORDER — FUROSEMIDE 80 MG PO TABS
80.0000 mg | ORAL_TABLET | Freq: Two times a day (BID) | ORAL | Status: DC
  Administered 2013-07-23 – 2013-07-24 (×2): 80 mg via ORAL
  Filled 2013-07-23 (×4): qty 1

## 2013-07-23 MED ORDER — POTASSIUM CHLORIDE CRYS ER 20 MEQ PO TBCR
30.0000 meq | EXTENDED_RELEASE_TABLET | Freq: Every day | ORAL | Status: DC
  Administered 2013-07-23 – 2013-07-24 (×2): 30 meq via ORAL
  Filled 2013-07-23 (×2): qty 1

## 2013-07-23 NOTE — Progress Notes (Signed)
Pt ambulating independently in hall with RW.

## 2013-07-23 NOTE — Progress Notes (Signed)
Pt ambulated 250 ft independently with RW.  HR remained stable, still with frequent PVC's.  Will cont plan of care.

## 2013-07-23 NOTE — Progress Notes (Signed)
Pt ambulated 229ft with RW and standby assist.  HR elevated to 110's during walk, and brief Afib, but back to NSR in the 80's on arrival to chair.  Complains only of stiffness in right knee.  Will con't plan of care.

## 2013-07-23 NOTE — Progress Notes (Signed)
Michael Levy Progress Note    Assessment/ Plan:   1. 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 Acute on chronic kidney disease Stage 3 - Baseline cr around 1.8  - likley acute increase due to renal hypoperfusion after CABG 9/12. Initially on pressors, currently stable off pressors.  - Cr slight increase to 2.35, potentially increase is related to low diastolics overnight, though may represent new baseline - still appears mildly volume overloaded with 1+ edema in LE - Continue to monitor chemistries - continue diuresis with PO lasix 80 mg daily  3. Hypokalemia: likely related to diuresis with lasix - low at 3.3, will replete with Kdur 30 mEq  4. Hypertension: improved pressures over last 24 hours - controlled on Metoprolol, norvasc, and Imdur   5. Anemia - stable, continue to monitor with CBC - patient slightly anemic when admitted, consider iron studies in the future   6. Metabolic Bone Disease: Consider checking PTH with CKD3, could do this as an outpatient.    Subjective:   Continues to do well. Wants to leave today. Has been passing gas and having BMs.   Objective:   BP 129/74  Pulse 78  Temp(Src) 99.5 F (37.5 C) (Oral)  Resp 17  Ht 5\' 9"  (1.753 m)  Wt 216 lb 1.6 oz (98.022 kg)  BMI 31.9 kg/m2  SpO2 96%  Intake/Output Summary (Last 24 hours) at 07/23/13 0846 Last data filed at 07/22/13 1300  Gross per 24 hour  Intake    480 ml  Output      0 ml  Net    480 ml   Weight change: 1 lb 6 oz (0.622 kg)  Physical Exam: Gen: NAD, laying comfortably in bed CVS: rrr, no mrg appreciated, sternotomy scar well healing Resp: minimal bibasilar crackles L>R, otherwise clear Abd: soft, distended, non-tender Ext: 1+-2+ LE Edema BL  Imaging:  CXR 9/14 IMPRESSION Mildly increased atelectasis with possible small effusion  Renal US 07/16/2013 IMPRESSION:  1. Bilateral renal cysts. No  hydronephrosis or diagnostic renal  calculus. Decompressed urinary bladder by Foley catheter.  Dg Chest Port 1 View  07/18/2013  IMPRESSION: Stable with infrahilar atelectasis.    Dg Abd Portable 1v  07/19/2013   IMPRESSION: Gas distention of the colon. No evidence of obstruction.    US Abdomen Complete  07/20/2013  IMPRESSION: 1. Trace ascites. 2. Gallbladder sludge. 3. Bilateral renal cysts.     Dg Chest 2 View  07/22/2013  IMPRESSION: 1. Bibasilar atelectasis or infiltrates, improved since previous exam. 2. Small residual left pleural effusion.       Labs: BMET  Recent Labs Lab 07/16/13 1800 07/17/13 0330 07/17/13 1400 07/18/13 0400 07/19/13 0420 07/20/13 0400 07/21/13 0310 07/22/13 0546 07/23/13 0500  NA 141 139 137 140 142 140 143 140 139  K 4.8 4.6 3.8 3.5 3.4* 3.6 3.4* 3.5 3.3*  CL 102 101 99 100 100 100 104 102 100  CO2 24 25 25 26 27 26 26 24 25   GLUCOSE 109* 118* 166* 123* 118* 110* 105* 95 103*  BUN 61* 68* 72* 75* 89* 89* 82* 74* 65*  CREATININE 3.35* 3.12* 2.88* 2.64* 2.96* 2.67* 2.20* 2.29* 2.35*  CALCIUM 8.8 8.5 9.2 9.0 9.5 9.4 9.0 8.9 9.2  PHOS  --  5.1*  --   --   --   --   --   --  4.6   CBC  Recent Labs  Lab 07/19/13 0420 07/20/13 0400 07/21/13 0310 07/22/13 0546  WBC 19.2* 18.3* 15.9* 17.9*  HGB 9.0* 8.9* 9.4* 8.0*  HCT 26.6* 26.9* 28.5* 24.2*  MCV 89.0 89.7 89.6 89.6  PLT 214 232 246 298    Medications:    . amiodarone  400 mg Oral BID  . amLODipine  2.5 mg Oral Daily  . aspirin EC  325 mg Oral Daily   Or  . aspirin  324 mg Per Tube Daily  . atorvastatin  40 mg Oral q1800  . bisacodyl  10 mg Oral Daily   Or  . bisacodyl  10 mg Rectal Daily  . colchicine  0.6 mg Oral Daily  . docusate sodium  200 mg Oral Daily  . enoxaparin (LOVENOX) injection  40 mg Subcutaneous Q24H  . feeding supplement (NEPRO CARB STEADY)  237 mL Oral BID BM  . furosemide  80 mg Oral Daily  . insulin aspart  0-15 Units Subcutaneous TID WC  . isosorbide  mononitrate  30 mg Oral Daily  . metoprolol tartrate  25 mg Oral BID   Or  . metoprolol tartrate  25 mg Per Tube BID  . moving right along book   Does not apply Once  . pantoprazole  40 mg Oral Daily  . potassium chloride  30 mEq Oral Daily  . sodium chloride  3 mL Intravenous Q12H      Michael Haven, MD  Nephrology Service PGY-2 07/23/2013, 8:46 AM  I have seen and examined this patient and agree with plan per Dr Michael Levy.  Renal fx stuck at Scr 2.3.  Suspect it will improve with more time.  Still with significant edema, so will increase lasix to BID. Michael Gomer T,MD 07/23/2013 10:37 AM

## 2013-07-23 NOTE — Progress Notes (Addendum)
      [address]       [place] [address]             [phone]        9 Days Post-Op Procedure(s) (LRB): CORONARY ARTERY BYPASS GRAFTING (CABG) (N/A) RADIAL ARTERY HARVEST (Left)  Subjective: Patient with complaints of less right knee pain. He was able to ambulate after given the Colchicine.He still does not have much appetite. He denies abdominal pain, nausea, or emesis.  Objective: Vital signs in last 24 hours: Temp:  [98.2 F (36.8 C)-99.5 F (37.5 C)] 99.5 F (37.5 C) (09/21 0354) Pulse Rate:  [78-90] 78 (09/21 0354) Cardiac Rhythm:  [-] Normal sinus rhythm (09/21 0730) Resp:  [16-17] 17 (09/21 0354) BP: (129-138)/(63-74) 129/74 mmHg (09/21 0354) SpO2:  [95 %-98 %] 96 % (09/21 0354) Weight:  [98.022 kg (216 lb 1.6 oz)] 98.022 kg (216 lb 1.6 oz) (09/21 0354)  Pre op weight  98 kg Current Weight  07/23/13 98.022 kg (216 lb 1.6 oz)      Intake/Output from previous day: 09/20 0701 - 09/21 0700 In: 840 [P.O.:840] Out: -    Physical Exam:  Cardiovascular: RRR, no murmurs, gallops, or rubs. Pulmonary: Diminished at bases bilaterally; no rales, wheezes, or rhonchi. Abdomen: Soft, non tender,distended, sporadic bowel sounds present. Extremities: Mild bilateral lower extremity edema. Right knee is slightly lessswollen and warm.  Wounds: Clean and dry.  No erythema or signs of infection.LUE wound is clean and dry.  Lab Results: CBC:  Recent Labs  07/21/13 0310 07/22/13 0546  WBC 15.9* 17.9*  HGB 9.4* 8.0*  HCT 28.5* 24.2*  PLT 246 298   BMET:   Recent Labs  07/22/13 0546 07/23/13 0500  NA 140 139  K 3.5 3.3*  CL 102 100  CO2 24 25  GLUCOSE 95 103*  BUN 74* 65*  CREATININE 2.29* 2.35*  CALCIUM 8.9 9.2    PT/INR:  Lab Results  Component Value Date   INR 1.36 07/14/2013   INR 1.03 07/12/2013   INR 1.2* 06/28/2013   ABG:  INR: Will add last result for INR, ABG once components are confirmed Will add last 4 CBG results once  components are confirmed  Assessment/Plan:  1. CV - Previous a fib with RVR.Appears to have have brief,occasional a fib with CVR and PACs. On Amiodarone 400 bid, Norvasc 2.5, Lopressor 25 bid,and Imdur 30 daily. IF PAF continues, may need Coumadin. 2.  Pulmonary -  Encourage incentive spirometer and flutter valve. 3. Volume Overload - On Lasix 80 daily. 4.  Anemia which is related to surgery and CKD - H and H 8 and 24.2. 5.CKD (stage 3)-creatinine 2.35 this am (baseline is around 1.9). Per nephrology 6.GI-post op ileus is resolving. On clear liquids. Diet per gastroenterology. Avoid narcotics as much as possible. 7.Leukocytiosis-WBC slightly increased from 15.9 to 17.9. Remains afebrile. No signs of wound infection and no GU complaints. Could be related to swollen right knee- gout.Continue Colchicine for swollen right knee. 8.Remove EPW in am 9.Gently supplement potassium   ZIMMERMAN,DONIELLE MPA-C 07/23/2013,8:16 AM  Patient seen and examined He says knee is better today He is very frustrated and wants to go home I suspect his renal function is about at his new baseline I told him if he walks twice more today, he can go home tomorrow

## 2013-07-24 ENCOUNTER — Encounter (HOSPITAL_COMMUNITY): Payer: Self-pay | Admitting: Surgical

## 2013-07-24 LAB — BASIC METABOLIC PANEL
CO2: 26 mEq/L (ref 19–32)
Chloride: 101 mEq/L (ref 96–112)
Creatinine, Ser: 2.39 mg/dL — ABNORMAL HIGH (ref 0.50–1.35)
GFR calc non Af Amer: 30 mL/min — ABNORMAL LOW (ref >90)

## 2013-07-24 LAB — CBC
HCT: 23.7 % — ABNORMAL LOW (ref 39.0–52.0)
Hemoglobin: 8 g/dL — ABNORMAL LOW (ref 13.0–17.0)
MCHC: 33.8 g/dL (ref 30.0–36.0)
MCV: 88.4 fL (ref 78.0–100.0)
Platelets: 361 10*3/uL (ref 150–400)
RBC: 2.68 MIL/uL — ABNORMAL LOW (ref 4.22–5.81)
WBC: 15.9 10*3/uL — ABNORMAL HIGH (ref 4.0–10.5)

## 2013-07-24 LAB — GLUCOSE, CAPILLARY

## 2013-07-24 MED ORDER — AMLODIPINE BESYLATE 2.5 MG PO TABS: 2.5000 mg | ORAL_TABLET | Freq: Every day | ORAL | Status: DC

## 2013-07-24 MED ORDER — POTASSIUM CHLORIDE CRYS ER 15 MEQ PO TBCR: 30.0000 meq | EXTENDED_RELEASE_TABLET | Freq: Every day | ORAL | Status: DC

## 2013-07-24 MED ORDER — ISOSORBIDE MONONITRATE ER 30 MG PO TB24: 30.0000 mg | ORAL_TABLET | Freq: Every day | ORAL | Status: DC

## 2013-07-24 MED ORDER — OXYCODONE HCL 5 MG PO TABS: 5.0000 mg | ORAL_TABLET | ORAL | Status: DC | PRN

## 2013-07-24 MED ORDER — FUROSEMIDE 80 MG PO TABS: 80.0000 mg | ORAL_TABLET | Freq: Two times a day (BID) | ORAL | Status: DC

## 2013-07-24 MED ORDER — AMIODARONE HCL 200 MG PO TABS: 400.0000 mg | ORAL_TABLET | Freq: Two times a day (BID) | ORAL | Status: DC

## 2013-07-24 MED ORDER — COLCHICINE 0.6 MG PO TABS: 0.6000 mg | ORAL_TABLET | Freq: Every day | ORAL | Status: DC

## 2013-07-24 MED ORDER — METOPROLOL TARTRATE 25 MG PO TABS: 25.0000 mg | ORAL_TABLET | Freq: Two times a day (BID) | ORAL | Status: DC

## 2013-07-24 NOTE — Progress Notes (Signed)
[facility] Progress Note    Assessment/ Plan:   1. CAD with 3 vessel disease s/p CABG day 10 - Hemodynamically stable - Management per primary team-plan for discharge today  2. Acute on chronic kidney disease Stage 3 - Baseline cr around 1.8-slow to return to this level  - likely acute increase due to renal hypoperfusion after CABG 9/12. Initially on pressors, currently stable off pressors.  - f/u Cr this morning - still appears mildly volume overloaded with 2+ edema in LE-continue lasix 80 mg PO BID- agree - Continue to monitor chemistries - will need f/u with nephrology as outpatient- says has seen someone at [facility] in past, will check and set him up for OP follow up  3. Hypokalemia: likely related to diuresis with lasix - f/u potassium this morning  4. Hypertension: improved pressures over last 24 hours - controlled on Metoprolol, norvasc, and Imdur   5. Anemia - stable, continue to monitor with CBC - patient slightly anemic when admitted, consider iron studies as outpatient   6. Metabolic Bone Disease: Consider checking PTH with CKD3, could do this as an outpatient.    Subjective:   Continues to do well. Eating better and having better BMs. Plans for d/c today.   Objective:   BP 112/69  Pulse 68  Temp(Src) 98.3 F (36.8 C) (Oral)  Resp 19  Ht 5\' 9"  (1.753 m)  Wt 213 lb 4.8 oz (96.752 kg)  BMI 31.48 kg/m2  SpO2 96%  Intake/Output Summary (Last 24 hours) at 07/24/13 [id number] Last data filed at 07/23/13 0830  Gross per 24 hour  Intake    240 ml  Output      0 ml  Net    240 ml   Weight change: -2 lb 12.8 oz (-1.27 kg)  Physical Exam: Gen: NAD, sitting comfortably in chair CVS: rrr, no mrg appreciated, sternotomy scar well healing Resp: CTAB, no wheezes or crackles noted Abd: soft, distended, non-tender Ext: 2+ LE Edema BL  Imaging:  CXR 9/14 IMPRESSION Mildly increased atelectasis with possible small effusion  Renal US  07/16/2013 IMPRESSION:  1. Bilateral renal cysts. No hydronephrosis or diagnostic renal  calculus. Decompressed urinary bladder by Foley catheter.  Dg Chest Port 1 View  07/18/2013  IMPRESSION: Stable with infrahilar atelectasis.    Dg Abd Portable 1v  07/19/2013   IMPRESSION: Gas distention of the colon. No evidence of obstruction.    US Abdomen Complete  07/20/2013  IMPRESSION: 1. Trace ascites. 2. Gallbladder sludge. 3. Bilateral renal cysts.     Dg Chest 2 View  07/22/2013  IMPRESSION: 1. Bibasilar atelectasis or infiltrates, improved since previous exam. 2. Small residual left pleural effusion.       Labs: BMET  Recent Labs Lab 07/18/13 0400 07/19/13 0420 07/20/13 0400 07/21/13 0310 07/22/13 0546 07/23/13 0500 07/24/13 0700  NA 140 142 140 143 140 139 139  K 3.5 3.4* 3.6 3.4* 3.5 3.3* 3.5  CL 100 100 100 104 102 100 101  CO2 26 27 26 26 24 25 26   GLUCOSE 123* 118* 110* 105* 95 103* 89  BUN 75* 89* 89* 82* 74* 65* 65*  CREATININE 2.64* 2.96* 2.67* 2.20* 2.29* 2.35* 2.39*  CALCIUM 9.0 9.5 9.4 9.0 8.9 9.2 8.9  PHOS  --   --   --   --   --  4.6  --    CBC  Recent Labs Lab 07/20/13 0400 07/21/13 0310 07/22/13 0546 07/24/13 0700  WBC 18.3* 15.9* 17.9*  15.9*  HGB 8.9* 9.4* 8.0* 8.0*  HCT 26.9* 28.5* 24.2* 23.7*  MCV 89.7 89.6 89.6 88.4  PLT 232 246 298 361    Medications:    . amiodarone  400 mg Oral BID  . amLODipine  2.5 mg Oral Daily  . aspirin EC  325 mg Oral Daily   Or  . aspirin  324 mg Per Tube Daily  . atorvastatin  40 mg Oral q1800  . bisacodyl  10 mg Oral Daily   Or  . bisacodyl  10 mg Rectal Daily  . colchicine  0.6 mg Oral Daily  . docusate sodium  200 mg Oral Daily  . enoxaparin (LOVENOX) injection  40 mg Subcutaneous Q24H  . feeding supplement (NEPRO CARB STEADY)  237 mL Oral BID BM  . furosemide  80 mg Oral BID  . insulin aspart  0-15 Units Subcutaneous TID WC  . isosorbide mononitrate  30 mg Oral Daily  . metoprolol tartrate  25  mg Oral BID   Or  . metoprolol tartrate  25 mg Per Tube BID  . moving right along book   Does not apply Once  . pantoprazole  40 mg Oral Daily  . potassium chloride  30 mEq Oral Daily  . sodium chloride  3 mL Intravenous Q12H      Leone Haven, MD  Nephrology Service PGY-2 07/24/2013, 8:28 AM   Patient seen and examined, agree with above note with above modifications. Plan is for discharge home today- says has seen an MD at [facility] in the past.  Will arrange OP nephrology follow up for him and he is agreeable.  Agree with continuing lasix 80 PO BID as OP Corliss Parish, MD 07/24/2013

## 2013-07-24 NOTE — Progress Notes (Signed)
07/24/2013 12:28 PM Nursing note Discharge avs form, medications already taken today and those due this evening given and explained to patient and family. RX given to patient and reviewed. Incision site care, activity restrictions, follow up appointments and when to call MD reviewed. Pt. Viewed video #113 and questions and concerns addressed. Jadene Pierini [facility] paged and made aware of pt. Fluid filled blister on right foot. Verbal orders received for pt. To continue to monitor for changes or signs of infection. Pt. Updated on this information and verbalized understanding.  D/c iv line. D/c tele. dc/ home with family per orders.  Reana Chacko, Arville Lime

## 2013-07-24 NOTE — Progress Notes (Signed)
07/24/2013 8:59 AM EPW and CTS d/c per orders and per protocol. Ends intact. Benzoin and steri strips applied to CTS sites per orders. Pt. Tolerated well. Advised bedrest for one hour. Call bell within reach. Vital signs collected per protocol. Will continue to monitor patient.  Michael Levy, Arville Lime

## 2013-07-24 NOTE — Progress Notes (Signed)
[name]       [place] [address]             [phone]      10 Days Post-Op  Procedure(s) (LRB): CORONARY ARTERY BYPASS GRAFTING (CABG) (N/A) RADIAL ARTERY HARVEST (Left) Subjective:  "ready to go home" . Feels fairly well, not much appetite  Objective  Telemetry sinus with occ PAC's, PVC's  Temp:  [98.3 F (36.8 C)-98.9 F (37.2 C)] 98.3 F (36.8 C) (09/22 0439) Pulse Rate:  [68-88] 68 (09/22 0439) Resp:  [16-19] 19 (09/22 0439) BP: (112-114)/(56-69) 112/69 mmHg (09/22 0439) SpO2:  [96 %-99 %] 96 % (09/22 0439) Weight:  [213 lb 4.8 oz (96.752 kg)] 213 lb 4.8 oz (96.752 kg) (09/22 0439)   Intake/Output Summary (Last 24 hours) at 07/24/13 0749 Last data filed at 07/23/13 0830  Gross per 24 hour  Intake    240 ml  Output      0 ml  Net    240 ml       General appearance: alert, cooperative and no distress Heart: regular rate and rhythm Lungs: mildly dim in left base Abdomen: soft, nontender, non-distended Extremities: + LE edema, left hand N/V intact Wound: incisions healing well  Lab Results:  Recent Labs  07/22/13 0546 07/23/13 0500  NA 140 139  K 3.5 3.3*  CL 102 100  CO2 24 25  GLUCOSE 95 103*  BUN 74* 65*  CREATININE 2.29* 2.35*  CALCIUM 8.9 9.2  PHOS  --  4.6    Recent Labs  07/23/13 0500  ALBUMIN 2.4*   No results found for this basename: LIPASE, AMYLASE,  in the last 72 hours  Recent Labs  07/22/13 0546  WBC 17.9*  HGB 8.0*  HCT 24.2*  MCV 89.6  PLT 298   No results found for this basename: CKTOTAL, CKMB, TROPONINI,  in the last 72 hours No components found with this basename: POCBNP,  No results found for this basename: DDIMER,  in the last 72 hours No results found for this basename: HGBA1C,  in the last 72 hours No results found for this basename: CHOL, HDL, LDLCALC, TRIG, CHOLHDL,  in the last 72 hours No results found for this basename: TSH, T4TOTAL, FREET3, T3FREE, THYROIDAB,  in the last 72  hours No results found for this basename: VITAMINB12, FOLATE, FERRITIN, TIBC, IRON, RETICCTPCT,  in the last 72 hours  Medications: Scheduled . amiodarone  400 mg Oral BID  . amLODipine  2.5 mg Oral Daily  . aspirin EC  325 mg Oral Daily   Or  . aspirin  324 mg Per Tube Daily  . atorvastatin  40 mg Oral q1800  . bisacodyl  10 mg Oral Daily   Or  . bisacodyl  10 mg Rectal Daily  . colchicine  0.6 mg Oral Daily  . docusate sodium  200 mg Oral Daily  . enoxaparin (LOVENOX) injection  40 mg Subcutaneous Q24H  . feeding supplement (NEPRO CARB STEADY)  237 mL Oral BID BM  . furosemide  80 mg Oral BID  . insulin aspart  0-15 Units Subcutaneous TID WC  . isosorbide mononitrate  30 mg Oral Daily  . metoprolol tartrate  25 mg Oral BID   Or  . metoprolol tartrate  25 mg Per Tube BID  . moving right along book   Does not apply Once  . pantoprazole  40 mg Oral Daily  . potassium chloride  30 mEq  Oral Daily  . sodium chloride  3 mL Intravenous Q12H     Radiology/Studies:  No results found.  INR: Will add last result for INR, ABG once components are confirmed Will add last 4 CBG results once components are confirmed  Assessment/Plan: S/P Procedure(s) (LRB): CORONARY ARTERY BYPASS GRAFTING (CABG) (N/A) RADIAL ARTERY HARVEST (Left)  1 Doing well, will d/c epw's this am and plan for D/C later today      LOS: 10 days    Michael Levy E 9/22/20147:49 AM

## 2013-07-24 NOTE — Discharge Summary (Signed)
[address]       [place] [address]             [phone]       Kairon Peloso [birth year] 47 y.o. [id number]  07/14/2013   Melrose Nakayama, MD  CAD  Mr. Castillo is a 47 year old gentleman who has a history of coronary disease dating back to age 67 when he had a heart attack treated with angioplasty and stenting. About a year later he had additional stents placed at [facility]. He has multiple cardiac risk factors including tobacco abuse, hypertension, hyperlipidemia, and a strong family history with his mother requiring CABG at age 48. He started having exertional chest pain in December of 2013. He described this as a pain across his chest with exertion; it was relieved by placing his arms above his head against the wall above his head. It would radiate to his right shoulder. Recently he's had a marked increase in frequency and severity of his anginal episodes, frequently having in 3-4 times per day. Often with as little exertion and is taking a shower. He also has had some episodes at rest while watching television. He does experience shortness of breath along with the chest pain.  He saw Dr. Johnsie Cancel who recommended cardiac catheterization. That was done by Dr. Martinique on Friday. It revealed severe three-vessel coronary disease with a totally occluded right coronary, 90% stenosis in a large first obtuse marginal, and a bifurcated LAD system with significant disease in both the anterior wall and septal branches. He was referred to Modesto Charon M.D. for cardiothoracic surgical consultation. Upon evaluation of the patient and his studies Dr. Roxan Hockey and agreed with recommendations to proceed with coronary artery surgical revascularization.  Past Medical History   Diagnosis  Date   .  Hyperlipidemia    .  Gout    .  Chest pain    .  Anemia      Unknown cause   .  Periodontal disease    .  Heart attack  2005     One stent placed; 2 years later had 4 more  stents placed   .  Reflux    .  Chest pain on exertion    .  Hypertension      Essential   .  Rapid heart rate    .  Chronic kidney disease (CKD), stage II (mild)     Past Surgical History   Procedure  Laterality  Date   .  Coronary angioplasty with stent placement     .  Cardiac catheterization       06/30/13 Dr Martinique    Family History   Problem  Relation  Age of Onset   .  Diabetes  Mother    .  Hyperlipidemia  Mother    .  Hypertension  Mother    .  Cancer  Father    .  Hyperlipidemia  Father    .  Hypertension  Father    .  Stroke  Father    .  Diabetes  Maternal Grandmother    .  Coronary artery disease  Mother  5     CABG at age 36    Social History  History   Substance Use Topics   .  Smoking status:  Current Every Day Smoker -- 2.00 packs/day for 36 years     Start date:  11/02/1977   .  Smokeless tobacco:  Not on  file   .  Alcohol Use:  Yes      Comment: 2 beers once a month    Current Outpatient Prescriptions   Medication  Sig  Dispense  Refill   .  aspirin 325 MG tablet  Take 325 mg by mouth daily.     Marland Kitchen  atorvastatin (LIPITOR) 40 MG tablet  Take 40 mg by mouth daily.     .  isosorbide mononitrate (IMDUR) 60 MG 24 hr tablet  Take 1 tablet (60 mg total) by mouth daily.  30 tablet  6   .  metoprolol tartrate (LOPRESSOR) 25 MG tablet  Take 25 mg by mouth daily.      No current facility-administered medications for this visit.    Allergies   Allergen  Reactions   .  Allopurinol      Kidney Failure       Hospital Course:  The patient was admitted to the hospital and taken to the operating room on 07/14/2013 and underwent Procedure(s): DATE OF PROCEDURE: 07/14/2013  DATE OF DISCHARGE:  OPERATIVE REPORT  PREOPERATIVE DIAGNOSIS: Severe three-vessel coronary disease with  progressive angina.  POSTOPERATIVE DIAGNOSIS: Severe three-vessel coronary disease with  progressive angina.  PROCEDURE: Median sternotomy, extracorporeal circulation, coronary  artery  bypass grafting x5 (sequential left internal mammary artery to  anterior and septal branches of LAD, left radial artery to obtuse  marginal 1, sequential saphenous vein graft to posterior descending and  posterior lateral), open left radial artery harvest, endoscopic  saphenous vein harvest from right leg.  SURGEON: Revonda Standard. Roxan Hockey, M.D.  ASSISTANT: Jadene Pierini, PA-C.  ANESTHESIA: General.  FINDINGS: Anterior and septal branches of LAD both small, fair quality  target vessels; posterolateral small, fair quality target; posterior  descending and OM1 good quality targets; left ventricular hypertrophy;  preserved left ventricular systolic function by echocardiogram.  The patient  was taken from the operating room to the surgical intensive care unit in  good condition.   Postoperative hospital course:  The patient initially had some difficulty with hyperkalemia. This initially required bicarbonate as well as insulin and Kayexalate. He also was noted to have an acute elevation of his creatinine and nephrology consultation was obtained. Multiple studies were obtained to better ascertain the etiology. Earliest proposed etiology was related to hypotensive ATN. He was treated additionally with dopamine and Lasix. This slowed weaning from the vent until he was more medically stable. He was able to be weaned however her on postoperative day #1. He initially did require inotropic support but these have been weaned without difficulty. Early postoperatively he additionally did have an ileus. Gastroenterology consultation was obtained. This was treated mostly conservatively, limiting narcotics and slow advancement of diet. He has improved over time. He continues to have a poor appetite but is eating regular food. Early in this process he had loose stools but they have improved and C. difficile is negative. Additionally postoperatively the patient developed knee pain which was treated with colchicine and  improved. All routine lines, monitors and drainage devices have been discontinued in the standard fashion. He continues to have a volume overload and nephrology is managing Lasix dosing which will continue as an outpatient. Most recent creatinine dated 07/23/2013 is 2.35 which is felt to be stable. He does have an acute blood loss anemia and values have stabilized in this regard with most recent hemoglobin on 9/22 8.0. Additionally the postoperative period the patient did have atrial fibrillation with RVR but has subsequently been chemically cardioverted  to sinus rhythm. He has been placed on postoperative Imdur were for his radial artery graft. All incisions are healing well without evidence of infection. He is tolerating gradually increasing activities using standard protocols. Overall he feels well and is anxious for discharge.     Recent Labs  07/23/13 0500 07/24/13 0700  NA 139 139  K 3.3* 3.5  CL 100 101  CO2 25 26  GLUCOSE 103* 89  BUN 65* 65*  CALCIUM 9.2 8.9    Recent Labs  07/22/13 0546 07/24/13 0700  WBC 17.9* 15.9*  HGB 8.0* 8.0*  HCT 24.2* 23.7*  PLT 298 361   No results found for this basename: INR,  in the last 72 hours   Discharge Instructions:  The patient is discharged to home with extensive instructions on wound care and progressive ambulation.  They are instructed not to drive or perform any heavy lifting until returning to see the physician in his office.  Discharge Diagnosis:  CAD Postoperative atrial fibrillation Acute exacerbation of chronic kidney disease now stage III. Postoperative acute blood loss anemia Postoperative hyperkalemia-resolved Right knee pain presumed gout-improved Secondary Diagnosis: Patient Active Problem List   Diagnosis Date Noted  . CKD (chronic kidney disease), stage II 07/04/2013  . Angina decubitus 06/28/2013  . Smoking 06/28/2013  . HTN (hypertension) 06/28/2013  . Elevated lipids 06/28/2013   Past Medical History    Diagnosis Date  . Hyperlipidemia   . Gout   . Anginal pain   . Anemia     Unknown cause  . Periodontal disease   . Heart attack 2005    One stent placed; 2 years later had 4 more stents placed  . Hypertension     Essential  . Rapid heart rate   . Chronic kidney disease (CKD), stage II (mild)     scored as stage 3 in 07/2013 with acute on chronic renal failure post CABG  . Emphysema 2014  . Headache(784.0)     occasional migraines  . Arthritis   . Atherosclerosis 06/2013    per CT age-advanced atherosclerosis of the aorta, great vessels and coronaries.  . Bilateral renal cysts 07/2013    per renal ultrasound         Medication List         amiodarone 200 MG tablet  Commonly known as:  PACERONE  Take 2 tablets (400 mg total) by mouth 2 (two) times daily. For 7 days and then take 200 mg twice daily     amLODipine 2.5 MG tablet  Commonly known as:  NORVASC  Take 1 tablet (2.5 mg total) by mouth daily.     aspirin 325 MG tablet  Take 325 mg by mouth daily.     atorvastatin 40 MG tablet  Commonly known as:  LIPITOR  Take 40 mg by mouth daily.     colchicine 0.6 MG tablet  Take 1 tablet (0.6 mg total) by mouth daily.     furosemide 80 MG tablet  Commonly known as:  LASIX  Take 1 tablet (80 mg total) by mouth 2 (two) times daily.     isosorbide mononitrate 30 MG 24 hr tablet  Commonly known as:  IMDUR  Take 1 tablet (30 mg total) by mouth daily.     metoprolol tartrate 25 MG tablet  Commonly known as:  LOPRESSOR  Take 1 tablet (25 mg total) by mouth 2 (two) times daily.     oxyCODONE 5 MG immediate release tablet  Commonly known  as:  Oxy IR/ROXICODONE  Take 1 tablet (5 mg total) by mouth every 4 (four) hours as needed.     potassium chloride SA 15 MEQ tablet  Commonly known as:  KLOR-CON M15  Take 2 tablets (30 mEq total) by mouth daily.       Follow-up Information   Follow up with MATTINGLY,MICHAEL T, MD. (the kidney doctors will arrange for an  appointment for you to follow-up)    Specialty:  Nephrology   Contact information:   [place] [address] [phone]       Follow up with Melrose Nakayama, MD. (August 15 2013 at 11:15 AM to see the surgeon. Please obtain a chest x-ray and [place] [facility] 1 hour prior to your appointment. [place] [facility] is located in the same office complex.)    Specialty:  Cardiothoracic Surgery   Contact information:   [place] [address] [phone]       Follow up with Peter Martinique, MD. (Please call to arrange an appointment for 2 weeks.)    Specialty:  Cardiology   Contact information:   [address] [place] [address] [phone]     The patient has been discharged on:   1.Beta Blocker:  Yes [  y ]                              No   [   ]                              If No, reason:  2.Ace Inhibitor/ARB: Yes [   ]                                     No  [   n ]                                     If No, reason:renal insufficiency  3.Statin:   Yes [ y  ]                  No  [   ]                  If No, reason:  4.Ecasa:  Yes  [ y  ]                  No   [   ]                  If No, reason:   Disposition: For discharge home  Patient's condition is [place], PA-C 07/24/2013  10:40 AM

## 2013-07-24 NOTE — Progress Notes (Addendum)
[id number] Cardiac Rehab Completed discharge education with pt. He voices understanding. He agrees to Outpt [facility] in [place], will send referral. Discussed smoking cessation with pt. I gave him tips for quitting and coaching contact number. Rodney Langton RN

## 2013-07-27 ENCOUNTER — Ambulatory Visit (INDEPENDENT_AMBULATORY_CARE_PROVIDER_SITE_OTHER): Payer: Self-pay | Admitting: Physician Assistant

## 2013-07-27 VITALS — BP 117/63 | HR 67 | Temp 98.0°F | Resp 20 | Ht 69.0 in | Wt 213.0 lb

## 2013-07-27 DIAGNOSIS — I2510 Atherosclerotic heart disease of native coronary artery without angina pectoris: Secondary | ICD-10-CM

## 2013-07-27 DIAGNOSIS — M79604 Pain in right leg: Secondary | ICD-10-CM

## 2013-07-27 DIAGNOSIS — Z951 Presence of aortocoronary bypass graft: Secondary | ICD-10-CM

## 2013-07-27 DIAGNOSIS — M79609 Pain in unspecified limb: Secondary | ICD-10-CM

## 2013-07-27 NOTE — Progress Notes (Addendum)
[address]       [place] [address]             [phone]                  [facility] Medical Record [id number] Date of Birth: Mar 23, 1966  Martinique, Peter M, MD No PCP Per Patient  Chief Complaint:   PostOp Follow Up Visit   History of Present Illness:     This is a 47 year old Caucasian male who is s/p CABGx5 on 07/14/2013 by Dr. Roxan Hockey. He was discharged from the hospital in stable condition on 07/24/2013.  He had gout in this knee during the latter half of his post operative course. He was treated with Colchicine which improved his symptoms.He presents today because of increasing pain in his lateral right knee. He denies any fever, chills, shortness of breath, chest pain, or abdominal pain, nausea, or emesis.  History  Smoking status  . Current Every Day Smoker -- 2.00 packs/day for 36 years  . Types: Cigarettes  . Start date: 11/02/1977  Smokeless tobacco  . Former User       Allergies  Allergen Reactions  . Allopurinol     Kidney Failure    Current Outpatient Prescriptions  Medication Sig Dispense Refill  . amiodarone (PACERONE) 200 MG tablet Take 2 tablets (400 mg total) by mouth 2 (two) times daily. For 7 days and then take 200 mg twice daily  70 tablet  1  . amLODipine (NORVASC) 2.5 MG tablet Take 1 tablet (2.5 mg total) by mouth daily.  30 tablet  1  . aspirin 325 MG tablet Take 325 mg by mouth daily.      Marland Kitchen atorvastatin (LIPITOR) 40 MG tablet Take 40 mg by mouth daily.      . colchicine 0.6 MG tablet Take 1 tablet (0.6 mg total) by mouth daily.  5 tablet  0  . furosemide (LASIX) 80 MG tablet Take 1 tablet (80 mg total) by mouth 2 (two) times daily.  60 tablet  0  . isosorbide mononitrate (IMDUR) 30 MG 24 hr tablet Take 1 tablet (30 mg total) by mouth daily.  30 tablet  0  . metoprolol tartrate (LOPRESSOR) 25 MG tablet Take 1 tablet (25 mg total) by mouth 2 (two) times daily.  60 tablet  1  . oxyCODONE (OXY  IR/ROXICODONE) 5 MG immediate release tablet Take 1 tablet (5 mg total) by mouth every 4 (four) hours as needed.  50 tablet  0  . potassium chloride (KLOR-CON M15) 15 MEQ tablet Take 2 tablets (30 mEq total) by mouth daily.  30 tablet  0   No current facility-administered medications for this visit.       Physical Exam: BP 117/63  Pulse 67  Temp(Src) 98 F (36.7 C) (Oral)  Resp 20  Ht 5\' 9"  (1.753 m)  Wt 96.616 kg (213 lb)  BMI 31.44 kg/m2  SpO2 98%  General appearance: alert, cooperative and no distress Neurologic: intact Heart: regular rate and rhythm, S1, S2 normal, no murmur, click, rub or gallop Lungs: clear to auscultation bilaterally Abdomen: soft, non-tender; bowel sounds normal; no masses,  no organomegaly Extremities: Rightl lower extremeity more edematous than left lower extremity. There is no warmth or reness of right knee. It has minor swelling. It is slightly tender on the right lateral knee. Negative Homans sign. Thigh is not swollen. Wound: Sternal wound is clean and dry.  Eschar present proximal wound. Left forearm wound has eschar and some erythema along skin edge (likely inflammatory reaction and NOT infection). Right lower EVH wound is clean and dry and no signs of infection.       Recent Radiology Findings: No results found.    Recent Labs: Lab Results  Component Value Date   WBC 15.9* 07/24/2013   HGB 8.0* 07/24/2013   HCT 23.7* 07/24/2013   PLT 361 07/24/2013   GLUCOSE 89 07/24/2013   ALT 17 07/21/2013   AST 21 07/21/2013   NA 139 07/24/2013   K 3.5 07/24/2013   CL 101 07/24/2013   CREATININE 2.39* 07/24/2013   BUN 65* 07/24/2013   CO2 26 07/24/2013   INR 1.36 07/14/2013   HGBA1C 5.8* 07/12/2013      Assessment / Plan:    He was discharged on Colchine 0.6 mg two times daily for gout. He is allergic to Alloupurinol so he cannot take this. We need to avoid NSAIDS as has CKD.The right knee is NOT warm or nearly as swollen as it was in the hospital when he  had gout. He denies any thigh or lower back pain or lower leg pain. Possible etiologies include arthritis, ileo tibial band syndrome, ligament problem.I offered to obtain a lower extremity duplex US to rule of DVT (although unlikely), but he states he will ask medical doctor if he needs this to be done. Patient is going to obtain a primary medical doctor to further evaluate his right knee. He was instructed to call us if develops any redness, drainage, fever, or increased swelling of RLE. He will return to see Dr. Roxan Hockey in a couple of weeks (as previously scheduled) for a routine post op visit.  Korin Hartwell M PA-C 07/27/2013 2:28 PM

## 2013-08-04 DIAGNOSIS — I4891 Unspecified atrial fibrillation: Secondary | ICD-10-CM | POA: Insufficient documentation

## 2013-08-04 DIAGNOSIS — I10 Essential (primary) hypertension: Secondary | ICD-10-CM | POA: Insufficient documentation

## 2013-08-04 DIAGNOSIS — J439 Emphysema, unspecified: Secondary | ICD-10-CM | POA: Insufficient documentation

## 2013-08-07 ENCOUNTER — Encounter: Payer: BC Managed Care – PPO | Admitting: Cardiovascular Disease

## 2013-08-11 ENCOUNTER — Telehealth: Payer: Self-pay | Admitting: Cardiovascular Disease

## 2013-08-11 ENCOUNTER — Other Ambulatory Visit: Payer: Self-pay | Admitting: *Deleted

## 2013-08-11 DIAGNOSIS — I2510 Atherosclerotic heart disease of native coronary artery without angina pectoris: Secondary | ICD-10-CM

## 2013-08-11 NOTE — Telephone Encounter (Signed)
FORM  FAXED  THIS AM .Adonis Housekeeper

## 2013-08-11 NOTE — Telephone Encounter (Signed)
New Problem  Pt asked if we have received orders and request for medical records for Cardiac Rehab through fax. Please sign and return.. Will re-fax

## 2013-08-14 ENCOUNTER — Ambulatory Visit
Admission: RE | Admit: 2013-08-14 | Discharge: 2013-08-14 | Disposition: A | Discharge status: Home / Self Care | Payer: BC Managed Care – PPO | Source: Ambulatory Visit | Attending: Thoracic Surgery (Cardiothoracic Vascular Surgery) | Admitting: Thoracic Surgery (Cardiothoracic Vascular Surgery)

## 2013-08-14 DIAGNOSIS — I2510 Atherosclerotic heart disease of native coronary artery without angina pectoris: Secondary | ICD-10-CM

## 2013-08-15 ENCOUNTER — Ambulatory Visit: Payer: BC Managed Care – PPO | Admitting: Thoracic Surgery (Cardiothoracic Vascular Surgery)

## 2013-08-15 ENCOUNTER — Other Ambulatory Visit: Payer: Self-pay | Admitting: Thoracic Surgery (Cardiothoracic Vascular Surgery)

## 2013-08-15 ENCOUNTER — Ambulatory Visit (INDEPENDENT_AMBULATORY_CARE_PROVIDER_SITE_OTHER): Payer: Self-pay | Admitting: Thoracic Surgery (Cardiothoracic Vascular Surgery)

## 2013-08-15 VITALS — BP 120/73 | HR 60 | Resp 16 | Ht 69.0 in | Wt 197.0 lb

## 2013-08-15 DIAGNOSIS — Z951 Presence of aortocoronary bypass graft: Secondary | ICD-10-CM

## 2013-08-15 DIAGNOSIS — I2510 Atherosclerotic heart disease of native coronary artery without angina pectoris: Secondary | ICD-10-CM

## 2013-08-15 LAB — BASIC METABOLIC PANEL
BUN: 22 mg/dL (ref 6–23)
Chloride: 104 mEq/L (ref 96–112)
Potassium: 6 mEq/L — ABNORMAL HIGH (ref 3.5–5.3)
Sodium: 138 mEq/L (ref 135–145)

## 2013-08-15 NOTE — Progress Notes (Signed)
HPI:  [name] turns today for a scheduled postoperative followup visit.  [name] is a 47 year old gentleman who underwent coronary bypass grafting x5 on September 12. We did use a left radial artery in addition to the LIMA. His postoperative course was complicated by atrial fibrillation, acute on chronic renal insufficiency, colonic ileus and gout.  He was seen back in our office by Michael Levy on 9/25. At that time he was having some right knee pain.  Since then his knee pain has resolved. His incisional pain is not bad, but he does get very uncomfortable at night. He has run out of pain medication and has not been taking anything for it. He does get tired pretty easily. He has lost some weight.  He missed his appointment with Dr. Martinique. He thinks he may have also missed appointment with [facility].  Past Medical History  Diagnosis Date  . Hyperlipidemia   . Gout   . Anginal pain   . Anemia     Unknown cause  . Periodontal disease   . Heart attack 2005    One stent placed; 2 years later had 4 more stents placed  . Hypertension     Essential  . Rapid heart rate   . Chronic kidney disease (CKD), stage II (mild)     scored as stage 3 in 07/2013 with acute on chronic renal failure post CABG  . Emphysema 2014  . Headache(784.0)     occasional migraines  . Arthritis   . Atherosclerosis 06/2013    per CT age-advanced atherosclerosis of the aorta, great vessels and coronaries.  . Bilateral renal cysts 07/2013    per renal ultrasound   . Atrial fibrillation     Postoperative      Current Outpatient Prescriptions  Medication Sig Dispense Refill  . amiodarone (PACERONE) 200 MG tablet Take 2 tablets (400 mg total) by mouth 2 (two) times daily. For 7 days and then take 200 mg twice daily  70 tablet  1  . amLODipine (NORVASC) 2.5 MG tablet Take 1 tablet (2.5 mg total) by mouth daily.  30 tablet  1  . aspirin 325 MG tablet Take 325 mg by mouth daily.      . colchicine  0.6 MG tablet Take 1 tablet (0.6 mg total) by mouth daily.  5 tablet  0  . furosemide (LASIX) 80 MG tablet Take 1 tablet (80 mg total) by mouth 2 (two) times daily.  60 tablet  0  . isosorbide mononitrate (IMDUR) 30 MG 24 hr tablet Take 1 tablet (30 mg total) by mouth daily.  30 tablet  0  . metoprolol tartrate (LOPRESSOR) 25 MG tablet Take 1 tablet (25 mg total) by mouth 2 (two) times daily.  60 tablet  1  . potassium chloride (KLOR-CON M15) 15 MEQ tablet Take 2 tablets (30 mEq total) by mouth daily.  30 tablet  0  . atorvastatin (LIPITOR) 40 MG tablet Take 40 mg by mouth daily.      Marland Kitchen zolpidem (AMBIEN) 5 MG tablet        No current facility-administered medications for this visit.    Physical Exam BP 120/73  Pulse 60  Resp 16  Ht 5\' 9"  (1.753 m)  Wt 197 lb (89.359 kg)  BMI 29.08 kg/m2  SpO22 66% 47 year old male in no acute distress Alert and oriented x3 no focal deficits Cardiac regular rate and rhythm normal S1 and S2 Lungs clear with equal breath sounds bilaterally Sternum stable,  incision healing well Left arm and right leg incisions healing well, no peripheral edema Left hand neurovascular intact  Diagnostic Tests: Chest x-ray 08/14/2013 EXAM:  CHEST 2 VIEW  COMPARISON: 07/22/2013  FINDINGS:  Previous median sternotomy and CABG procedure. There is moderate  cardiac enlargement. No pleural effusion or edema identified. No  airspace consolidation.  IMPRESSION:  1. No acute cardiopulmonary abnormalities.  2. Cardiac enlargement  Electronically Signed  By: Kerby Moors M.D.  On: 08/14/2013 08:53  Impression: 47 year old gentleman who underwent coronary bypass grafting x5 about a month ago. He is still having some incisional discomfort which is not surprising. This is mostly an issue at night, and he is having trouble getting comfortable and sleeping. I gave him a prescription for an additional 40 oxycodone tablets. He is to take 1-2 of these twice daily as needed for  pain. He should not drive while taking them.  His knee pain has resolved.  He did have acute on chronic renal insufficiency postoperatively. I am going to order another basic metabolic panel to check on his creatinine. We also need to arrange a followup appointment for him with [facility]  He may drive on a limited basis. Appropriate precautions were discussed. He actually drove up. From [place] this morning without any issues. I did recommend that he avoid traffic and high speeds as much as possible.  He is not lift anything over 10 pounds for another 2 weeks.  Plan: Arrange followup with Dr. Martinique and [facility]  I will be happy to see him back any time if I can be of any further assistance with his care.

## 2013-08-23 ENCOUNTER — Other Ambulatory Visit: Payer: Self-pay

## 2013-08-23 DIAGNOSIS — I701 Atherosclerosis of renal artery: Secondary | ICD-10-CM

## 2013-08-24 ENCOUNTER — Telehealth: Payer: Self-pay | Admitting: *Deleted

## 2013-08-24 LAB — BASIC METABOLIC PANEL
BUN: 24 mg/dL — ABNORMAL HIGH (ref 6–23)
CO2: 24 mEq/L (ref 19–32)
Chloride: 105 mEq/L (ref 96–112)
Creat: 2.38 mg/dL — ABNORMAL HIGH (ref 0.50–1.35)
Glucose, Bld: 69 mg/dL — ABNORMAL LOW (ref 70–99)
Potassium: 6.4 mEq/L (ref 3.5–5.3)

## 2013-08-24 NOTE — Telephone Encounter (Signed)
Michael Levy had an office visit with Dr. Roxan Hockey on 08/15/12 and a BMET was done the same day.  His K was elevated and I called him and advised him to stop his potassium supplements and have the blood work redone on 08/23/13.  His K remains elevated and I have tried to contact him to discuss this.  His voicemail is full and hasn't returned my call.  He has an appointment with Michael Levy, P.A. on 08/25/13.  Hopefully he will make his appointment and this can be discussed.  He had also been requested to see his doctor at [facility].

## 2013-08-25 ENCOUNTER — Encounter: Payer: Self-pay | Admitting: Physician Assistant

## 2013-08-25 ENCOUNTER — Ambulatory Visit (INDEPENDENT_AMBULATORY_CARE_PROVIDER_SITE_OTHER): Payer: BC Managed Care – PPO | Admitting: Physician Assistant

## 2013-08-25 ENCOUNTER — Telehealth: Payer: Self-pay | Admitting: *Deleted

## 2013-08-25 VITALS — BP 140/77 | HR 53 | Ht 69.0 in | Wt 199.0 lb

## 2013-08-25 DIAGNOSIS — I4891 Unspecified atrial fibrillation: Secondary | ICD-10-CM

## 2013-08-25 DIAGNOSIS — I10 Essential (primary) hypertension: Secondary | ICD-10-CM

## 2013-08-25 DIAGNOSIS — N189 Chronic kidney disease, unspecified: Secondary | ICD-10-CM

## 2013-08-25 DIAGNOSIS — E785 Hyperlipidemia, unspecified: Secondary | ICD-10-CM

## 2013-08-25 DIAGNOSIS — E875 Hyperkalemia: Secondary | ICD-10-CM

## 2013-08-25 DIAGNOSIS — F17200 Nicotine dependence, unspecified, uncomplicated: Secondary | ICD-10-CM

## 2013-08-25 DIAGNOSIS — I2589 Other forms of chronic ischemic heart disease: Secondary | ICD-10-CM

## 2013-08-25 DIAGNOSIS — I2510 Atherosclerotic heart disease of native coronary artery without angina pectoris: Secondary | ICD-10-CM

## 2013-08-25 LAB — BASIC METABOLIC PANEL
BUN: 22 mg/dL (ref 6–23)
Calcium: 9.7 mg/dL (ref 8.4–10.5)
Creat: 1.92 mg/dL — ABNORMAL HIGH (ref 0.50–1.35)
Glucose, Bld: 68 mg/dL — ABNORMAL LOW (ref 70–99)
Potassium: 5.8 mEq/L — ABNORMAL HIGH (ref 3.5–5.3)
Sodium: 136 mEq/L (ref 135–145)

## 2013-08-25 MED ORDER — SODIUM POLYSTYRENE SULFONATE PO POWD: ORAL | Status: DC

## 2013-08-25 MED ORDER — FUROSEMIDE 80 MG PO TABS: 40.0000 mg | ORAL_TABLET | Freq: Every day | ORAL | Status: DC

## 2013-08-25 MED ORDER — ATORVASTATIN CALCIUM 40 MG PO TABS: 40.0000 mg | ORAL_TABLET | Freq: Every day | ORAL | Status: DC

## 2013-08-25 NOTE — Patient Instructions (Addendum)
Call 1-800-QUIT-NOW [phone]) for help with quitting smoking.   Your physician has recommended you make the following change in your medication:  STAY ON LASIX BUT CHANGE TO ( 40 MG) DAILY STOP AMIODARONE START KAYEXALATE TODAY ( 30 GRAMS),ONE DOSE,  TOMORROW, 10 /25 AND Sunday, 10/26 THAN STOP MEDICATION   Your physician recommends that you HAVE STAT lab work TODAY/ BMET   Your physician recommends that you return for lab work ON Monday 10/27 FOR REPEAT LAB WORK, WALK IN FROM 8-5  Needs appointment with [facility] ASAP I spoke with Jeani Hawking, who works with Dr. Posey Pronto I will fax all recent labs over to office and Dr. Lynnell Chad office will call patient to set up appointment   Your physician recommends that you  KEEP YOUR follow-up appointment with Dr. Johnsie Cancel Monday, December 15 @ 4:30

## 2013-08-25 NOTE — Telephone Encounter (Signed)
I s.w with  Vale Haven from [facility] @ [phone] about pt to let them know I needed to get pt appointment and I was transferred to Dr. Serita Grit nurse Jeani Hawking. I stated had high potassium last two lab checks 6.4 and 6,  was post cabg x 5 with chronic renal failure and Jeani Hawking stated should have kept his last appointment, stated to fax over all recent labs at [phone] and Dr. Posey Pronto will let her know what to do and when to make an appointment, it was up to himI faxed labs over waiting for stat bmet today to fax over I will let scheduling to know to f/u and route this to Julaine Hua, CMA to f/u

## 2013-08-25 NOTE — Progress Notes (Signed)
[place], [place], [place]  [address] Phone: [phone] Fax:  [phone]  Date:  08/25/2013   ID:  Michael Levy, DOB September 01, 1966, MRN [id number]  PCP:  No PCP Per Patient  Cardiologist:  Dr. Jenkins Rouge     History of Present Illness: Michael Levy is a 47 y.o. male who returns for follow up after a recent admission to the hospital.   He has a history of CAD, status post prior MI and multivessel PCI in the past, HTN, HL, tobacco abuse. He was evaluated by Dr. Johnsie Cancel 8/27 with complaints of chest discomfort consistent with CCS class 2-3 angina.  He was set up for cardiac catheterization which demonstrated three-vessel CAD, inferior HK, EF 45%. CABG was recommended.  Carotid US (9/14): Right 40-59%, left 40-59%.  He underwent CABG with Dr. Roxan Hockey 07/14/13 (sequential LIMA-anterior and septal branches of LAD, left radial-OM1, SVG-PDA and PL). Postoperative course was complicated by hyperkalemia, acute kidney injury on chronic kidney disease (followed by nephrology) likely felt to be related to renal hypoperfusion after surgery, ileus (followed by GI), acute gouty attack, atrial fibrillation with RVR (converted to NSR with amiodarone).  Since d/c, he has been doing fairly well.  Chest is sore.  No significant dyspnea.  He is NYHA Class II.  No orthopnea, PND, edema.  No syncope.  Labs done at Dr. Leonarda Salon office demonstrated Hyperkalemia (K+ 6) last week.  His K+ supplement was stopped.  Labs done 2 days ago showed a higher K+ of 6.4.  He has not received any treatment yet for his hyperkalemia.  He has not seen nephrology since d/c.  He ran out of several of his medications (including lasix) several days ago.   Labs (9/14):   K 3.5, creatinine 2.39, Hgb 8 Labs (10/14): K 6=> 6.4, 2.03=> 2.38   Wt Readings from Last 3 Encounters:  08/15/13 197 lb (89.359 kg)  07/27/13 213 lb (96.616 kg)  07/24/13 213 lb 4.8 oz (96.752 kg)     Past Medical History  Diagnosis Date    . Hyperlipidemia   . Gout   . Anginal pain   . Anemia     Unknown cause  . Periodontal disease   . Heart attack 2005    One stent placed; 2 years later had 4 more stents placed  . Hypertension     Essential  . Rapid heart rate   . Chronic kidney disease (CKD), stage II (mild)     scored as stage 3 in 07/2013 with acute on chronic renal failure post CABG  . Emphysema 2014  . Headache(784.0)     occasional migraines  . Arthritis   . Atherosclerosis 06/2013    per CT age-advanced atherosclerosis of the aorta, great vessels and coronaries.  . Bilateral renal cysts 07/2013    per renal ultrasound   . Atrial fibrillation     Postoperative    Current Outpatient Prescriptions  Medication Sig Dispense Refill  . amiodarone (PACERONE) 200 MG tablet Take 2 tablets (400 mg total) by mouth 2 (two) times daily. For 7 days and then take 200 mg twice daily  70 tablet  1  . amLODipine (NORVASC) 2.5 MG tablet Take 1 tablet (2.5 mg total) by mouth daily.  30 tablet  1  . aspirin 325 MG tablet Take 325 mg by mouth daily.      Marland Kitchen atorvastatin (LIPITOR) 40 MG tablet Take 40 mg by mouth daily.      . colchicine  0.6 MG tablet Take 1 tablet (0.6 mg total) by mouth daily.  5 tablet  0  . furosemide (LASIX) 80 MG tablet Take 1 tablet (80 mg total) by mouth 2 (two) times daily.  60 tablet  0  . isosorbide mononitrate (IMDUR) 30 MG 24 hr tablet Take 1 tablet (30 mg total) by mouth daily.  30 tablet  0  . metoprolol tartrate (LOPRESSOR) 25 MG tablet Take 1 tablet (25 mg total) by mouth 2 (two) times daily.  60 tablet  1  . potassium chloride (KLOR-CON M15) 15 MEQ tablet Take 2 tablets (30 mEq total) by mouth daily.  30 tablet  0  . zolpidem (AMBIEN) 5 MG tablet        No current facility-administered medications for this visit.    Allergies:    Allergies  Allergen Reactions  . Allopurinol     Kidney Failure    Social History:  The patient  reports that he has been smoking Cigarettes.  He started  smoking about 35 years ago. He has a 72 pack-year smoking history. He has quit using smokeless tobacco. He reports that he drinks alcohol. He reports that he does not use illicit drugs.   Family History:  The patient's family history includes Cancer in his father; Coronary artery disease (age of onset: 56) in his mother; Diabetes in his maternal grandmother and mother; Hyperlipidemia in his father and mother; Hypertension in his father and mother; Stroke in his father.   ROS:  Please see the history of present illness.   Denies nausea, vomiting, dizzines.   All other systems reviewed and negative.   PHYSICAL EXAM: VS:  BP 140/77  Pulse 53  Ht 5\' 9"  (1.753 m)  Wt 199 lb (90.266 kg)  BMI 29.37 kg/m2 Well nourished, well developed, in no acute distress HEENT: normal Neck: no JVD Cardiac:  normal S1, S2; RRR; no murmur Lungs:  clear to auscultation bilaterally, no wheezing, rhonchi or rales Abd: soft, nontender, no hepatomegaly Ext: no edema Skin: warm and dry Neuro:  CNs 2-12 intact, no focal abnormalities noted  EKG:  Sinus brady, HR 52, RBBB, inf Q waves, NSSTTW changes (no peaked T waves noted)     ASSESSMENT AND PLAN:  1. Hyperkalemia:  Recent K+ very high.  He has been off K+ supplement for 1 week and Lasix for 2 days.  ECG is non-acute.  He is asymptomatic.  Reviewed with Dr. Ena Dawley (DOD).  Will give him Kayexalate 30 grams po x 1 today and repeat on 10/25 and 10/26.  Resume Lasix 40 mg QD.  Check stat BMET.  Repeat BMET on 10/27.  Arrange f/u with nephrology soon. 2. CAD:  Doing well post CABG.  He is attending cardiac rehab in [place].  Continue ASA, statin.   3. Ischemic Cardiomyopathy:  EF 45% prior to CABG.  Volume stable.  Continue beta blocker.  Not a candidate for ACEI.  Continue nitrates.  Eventually try to start hydralazine.  4. Post-Operative Atrial Fibrillation:  Maintaining NSR.  D/c Amiodarone.  5. CKD:  Treat hyperkalemia as noted above.  Arrange f/u with  nephrology soon.  Resume Lasix 40 mg QD. 6. Hypertension:  Fair control.  7. Hyperlipidemia:  Continue statin.  8. Tobacco Abuse:  We discussed the importance of cessation.  9. Disposition:  F/u with nephrology soon.  F/u with Dr. Jenkins Rouge in 2 mos.   Signed, Richardson Dopp, PA-C  08/25/2013 10:54 AM

## 2013-08-28 ENCOUNTER — Other Ambulatory Visit: Payer: BC Managed Care – PPO

## 2013-08-29 ENCOUNTER — Telehealth: Payer: Self-pay | Admitting: *Deleted

## 2013-08-29 ENCOUNTER — Encounter: Payer: Self-pay | Admitting: *Deleted

## 2013-08-29 NOTE — Telephone Encounter (Signed)
cannot lmom  due to mail box full on pt's phone. Tried Elenore Paddy significant spouse and her phone keeps ringing busy. I will send out a letter today to the pt about needing this bmet due to high K+ and was given Kayexalate 08/25/13. Will call later.

## 2013-08-29 NOTE — Telephone Encounter (Signed)
Pharmacy called in for clarification on rx for Kayexalate. They state it was sent in all wrong. Wrong quantity and with 2 sets of instructions. Late entry. On Friday 08/25/13 late evening, pharmacy left a vm on refill line for clarification. It had Dr. Thera Flake name on it so I called [facility], who told me that it was [facility] pt, but it was being handled by triage. Pharmacy didn't dispense medicine, and called again for clarification on 08/29/13 at 2:20 pm.

## 2013-08-30 NOTE — Telephone Encounter (Signed)
Michael Levy. PA s/w pt and significant other [name] about labs from 10/24 and about kayexalate order from 08/25/13. Pt did not come in 08/28/13 for repeat bmet as instructed; pt did agree to have bmet today however; at PCP in [place]. I called PCP Dr. Ilene Qua and s/w [name] who states their pt's get labs done at [facility], she gave me fax # [phone], phone # [phone]. I called [facility] and verified fax #,[phone] stated I was faxing over lab for a STAT BMET today and to fax results to  Michael Levy, [place]. Fax confirmed as going through at 12:12 pm 08/30/13. I did state on fax if CRITICAL lab results to please call [phone] results to s/w Michael Levy, PAC as well.

## 2013-08-31 ENCOUNTER — Telehealth: Payer: Self-pay | Admitting: *Deleted

## 2013-08-31 NOTE — Telephone Encounter (Signed)
Richardson Dopp stated to call [facility] [phone] to check on pt stat bmet. I left a detailed message to send over lab results and asked for a call back to let me know if received message

## 2013-08-31 NOTE — Telephone Encounter (Signed)
S/w Threasa and faxed results over for stat bmet

## 2013-09-01 ENCOUNTER — Other Ambulatory Visit: Payer: Self-pay | Admitting: Physician Assistant

## 2013-09-01 ENCOUNTER — Telehealth: Payer: Self-pay | Admitting: *Deleted

## 2013-09-01 DIAGNOSIS — E875 Hyperkalemia: Secondary | ICD-10-CM

## 2013-09-01 LAB — BASIC METABOLIC PANEL
Calcium: 9.7 mg/dL (ref 8.4–10.5)
Chloride: 101 mEq/L (ref 96–112)
Glucose, Bld: 151 mg/dL — ABNORMAL HIGH (ref 70–99)
Sodium: 133 mEq/L — ABNORMAL LOW (ref 135–145)

## 2013-09-01 NOTE — Telephone Encounter (Signed)
lmptcb for lab results 

## 2013-09-06 MED ORDER — SODIUM POLYSTYRENE SULFONATE 15 GM/60ML PO SUSP: 15.0000 g | Freq: Once | ORAL | Status: DC

## 2013-09-06 NOTE — Addendum Note (Signed)
Addended byKathlen Mody, Nicki Reaper T on: 09/06/2013 05:11 PM   Modules accepted: Orders

## 2013-09-08 NOTE — Telephone Encounter (Signed)
I have tried again to reach pt. Pt voice mail box full, Significant other phone is always busy, LPTCB on friend's cell Daun Peacock stressed [place] for ptcb TODAY. I called pharm and pt has not picked up Kayexalate ordered 11/5 by Brynda Rim. PA.

## 2013-09-08 NOTE — Telephone Encounter (Signed)
lmptcb x 3 and stressed high importance that ptcb to discuss lab results. I did state he also needs to pick up his kayexalate that was called in 2 days ago and we will cb Monday 09/11/13 to go over results

## 2013-09-12 NOTE — Telephone Encounter (Signed)
Finally reached pt's friend Michael Levy. I asked if he had s/w pt to let him know that I had called the other day and stressed the importance of pt rtn my call and about the medication (kayexalate). He said he told pt about the medicine. I called pharmacy again and pt still has not picked up the kayexalate. I asked Remo Lipps if he will please tell the pt tcb tomorrow and that I will also try to call him again tomorrow. Remo Lipps states pt has gone back to work now and does not get home until around 4:30-4:45. I said that was fine that I work until 6 pm.

## 2013-09-21 NOTE — Telephone Encounter (Signed)
lmptcb urgent to return call today. Pt has not been returning our calls. I s/w Shirlee Limerick at pharmacy today, she states pt picked up Kayexalate on 09/13/13 @ 4:46 pm.  Kayexalate was ordered on 09/06/13 by Richardson Dopp, PAC.  I did leave my direct ext for the desk I am at today [phone]. Cannot lmom pt's phone due to box is full, significant other phone is always busy; LMPTCB on friend Daun Peacock phone again stressing the urgency of pt needing to return our call.

## 2013-09-29 NOTE — Telephone Encounter (Signed)
tried to reach pt again. Still cannot lm on pt's phone voice mail box full, significant other's phone busy all the time, so I lmptcb on friend Daun Peacock phone for ptcb to f/u. Pt can reach me today [phone].

## 2013-10-10 NOTE — Telephone Encounter (Signed)
No answer. Tried to reach pt again; Pt not returning any of our calls when we have been able to lmom. Pt scheduled to see Dr. Johnsie Cancel 10/16/13.

## 2013-10-16 ENCOUNTER — Ambulatory Visit: Payer: BC Managed Care – PPO | Admitting: Cardiovascular Disease

## 2013-10-16 NOTE — Telephone Encounter (Signed)
lmptcb on pt's friends cell phone. Pt still has not called back from any of our attempts to reach him in reference to lab work back in 08/2013 with K+ 5.9, see previous phone notes. Pt has appt today w/Nishan, Dr. Johnsie Cancel made aware of attempts.

## 2013-10-28 ENCOUNTER — Other Ambulatory Visit: Payer: Self-pay | Admitting: Thoracic Surgery (Cardiothoracic Vascular Surgery)

## 2013-11-01 ENCOUNTER — Encounter: Payer: Self-pay | Admitting: Cardiovascular Disease

## 2014-10-11 ENCOUNTER — Encounter (HOSPITAL_COMMUNITY): Payer: Self-pay | Admitting: Cardiology

## 2015-08-16 IMAGING — CR DG CHEST 1V PORT
1 series · 1 of 1 positions shown · non-contrast
Comparison: 07/15/2013 and earlier.

CLINICAL DATA: 47-year-old male status post chest tube removal.

EXAM:
PORTABLE CHEST - 1 VIEW

[AP]
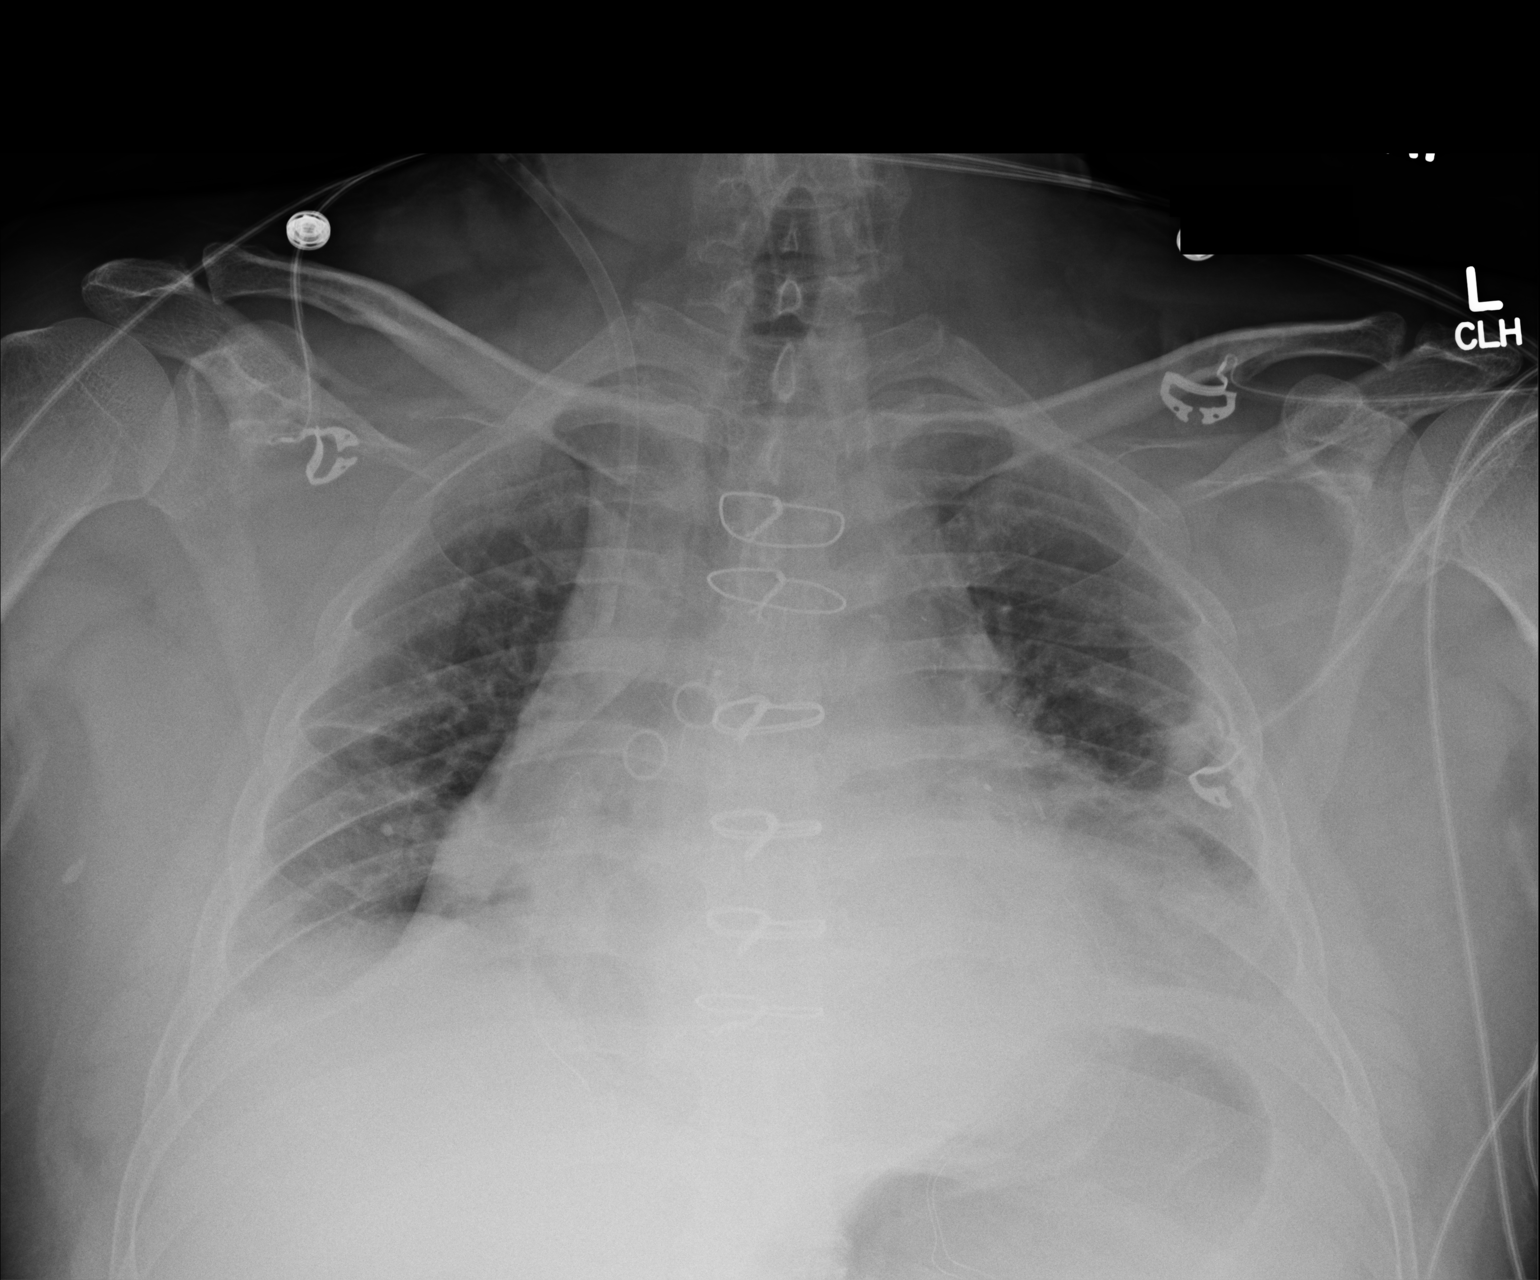

[1 of 1 positions shown; findings below may reference images not displayed]

FINDINGS: Portable AP upright view at 7654 hrs. Extubated. Mediastinal and
left chest tube removed. Enteric tube removed. Swan-Ganz catheter
removed, right IJ introducer sheath remains.

No pneumothorax. Stable lung volumes. Stable cardiomegaly and
mediastinal contours. Perihilar/infrahilar opacity rib most
suggestive of atelectasis is stable to mildly increased. Probable
small effusions. No pulmonary edema.
IMPRESSION: 1. Extubated, enteric, [place], and chest tubes removed. No
pneumothorax.

2. Stable lung volumes. Mildly increased atelectasis. Small effusion
suspected.

## 2015-08-19 IMAGING — CR DG ABD PORTABLE 1V
1 series · 1 of 1 positions shown · non-contrast
Comparison: Chest radiograph 07/16/2013

CLINICAL DATA: Abdominal distension

EXAM:
PORTABLE ABDOMEN - 1 VIEW

[AP]
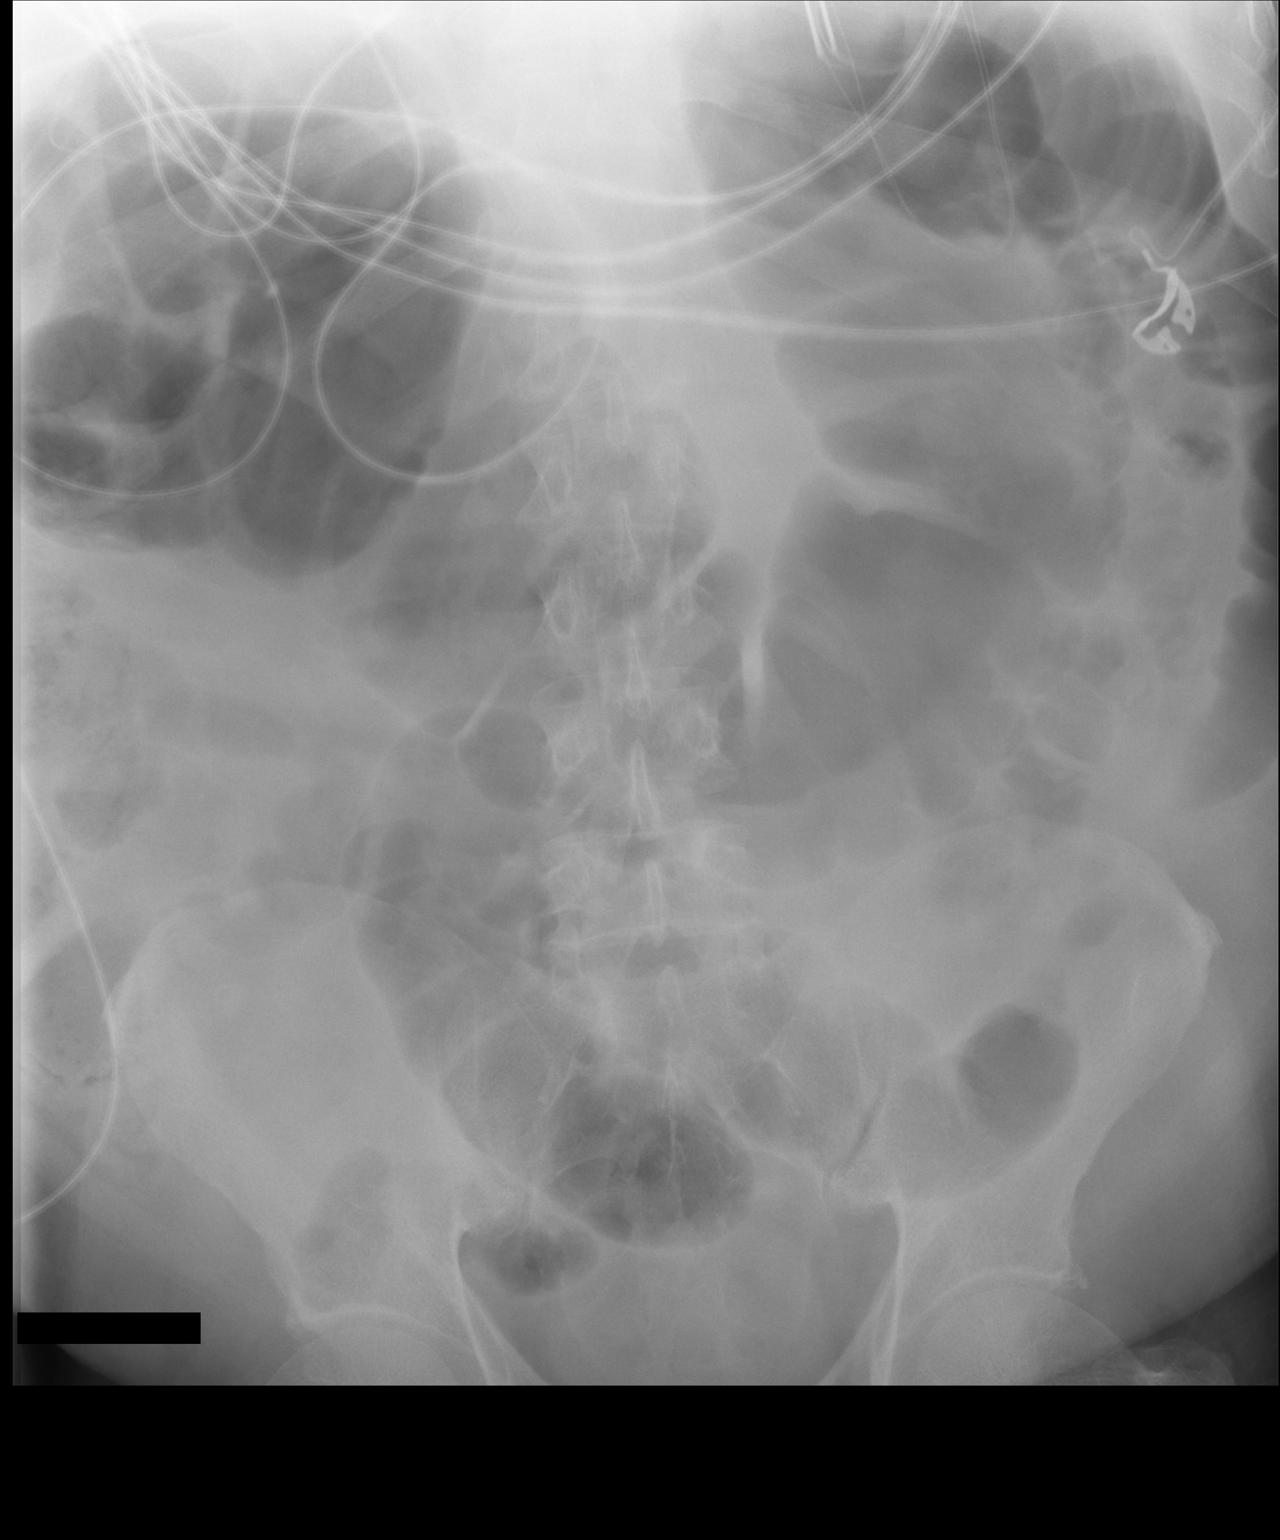

[1 of 1 positions shown; findings below may reference images not displayed]

FINDINGS: There is gaseous distension of the colon which is not pathologically
dilated. No clear small bowel dilatation. Gas in the sigmoid colon
and rectum. No pathologic calcifications.
IMPRESSION: Gas distention of the colon. No evidence of obstruction.

## 2015-08-20 IMAGING — CR DG ABD PORTABLE 1V
1 series · 1 of 1 positions shown · non-contrast
Comparison: Portable chest x-ray of 07/18/2013

CLINICAL DATA: Followup ileus

EXAM:
PORTABLE ABDOMEN - 1 VIEW

[AP]
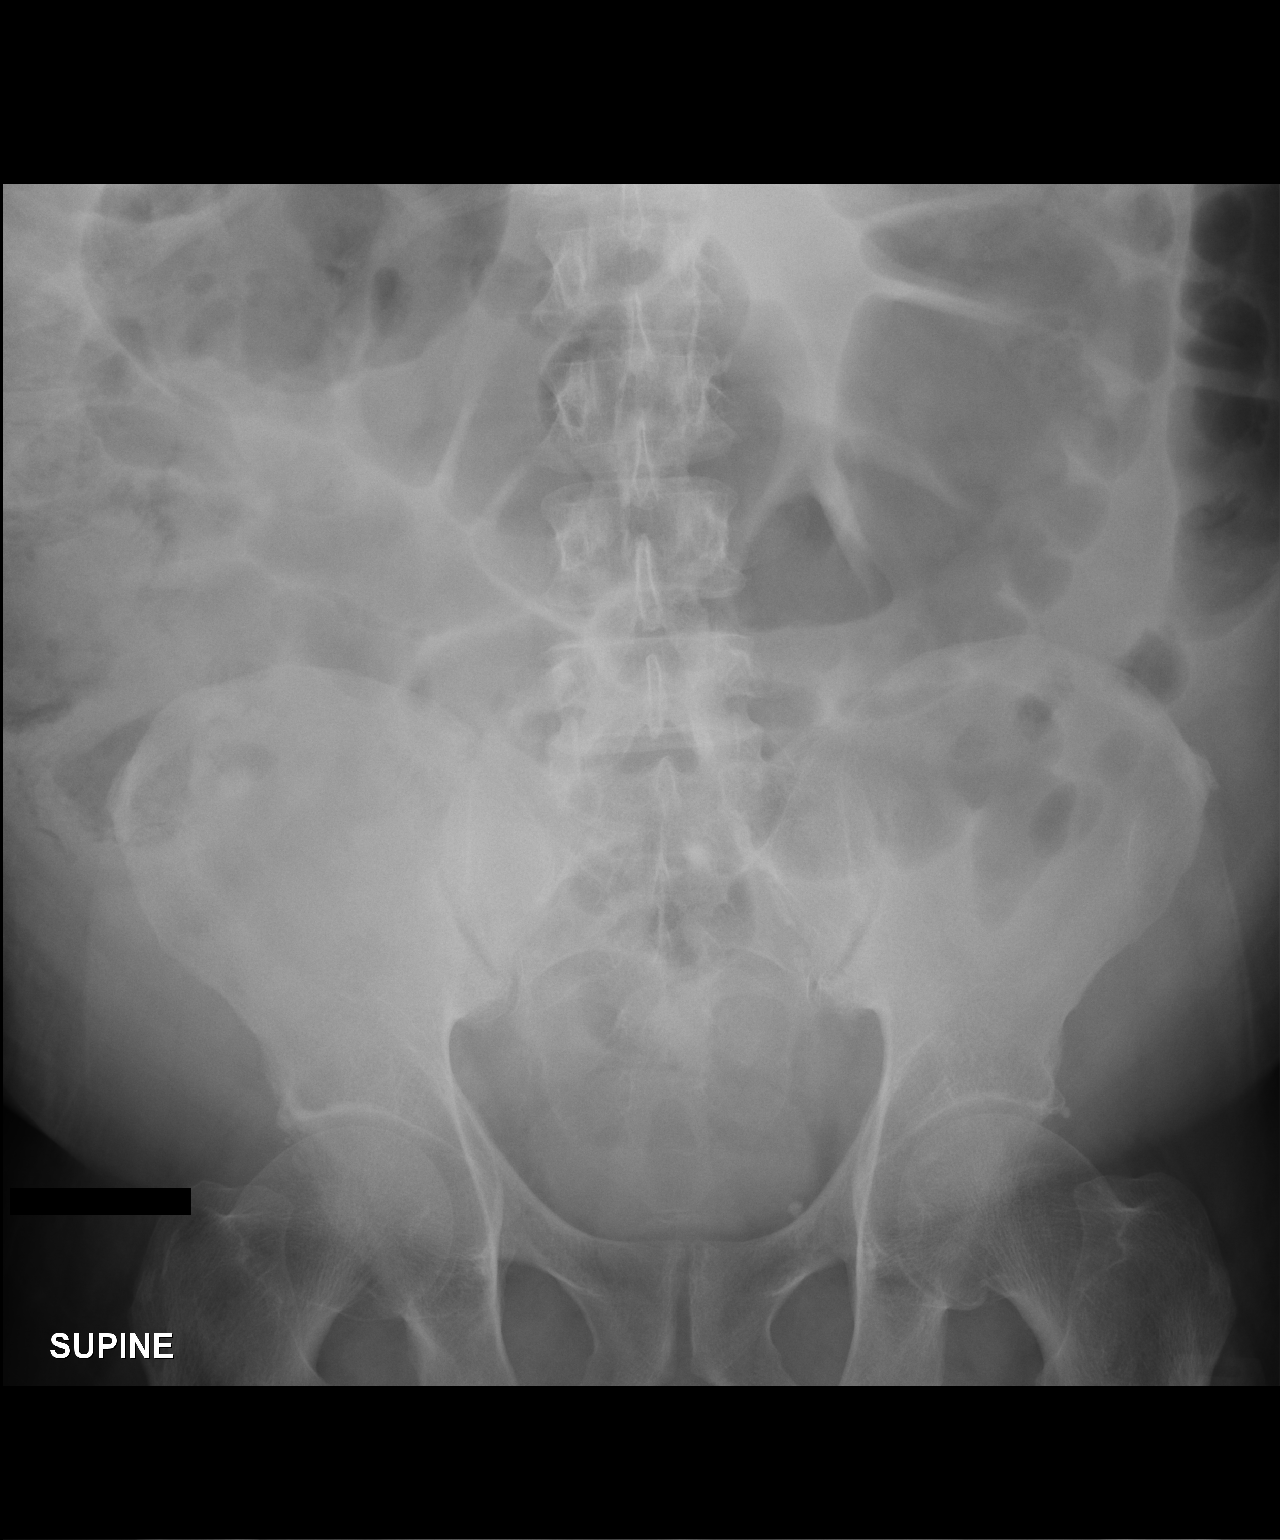

[1 of 1 positions shown; findings below may reference images not displayed]

FINDINGS: There is slight gaseous distention of large and small bowel most
consistent with minimal ileus. No definite obstruction is seen. No
opaque calculi are noted.
IMPRESSION: Minimal ileus.

## 2015-08-21 IMAGING — CR DG ABD PORTABLE 1V
1 series · 1 of 1 positions shown · non-contrast
Comparison: 07/20/2013.

CLINICAL DATA: Colonic distention.

EXAM:
PORTABLE ABDOMEN - 1 VIEW

[AP]
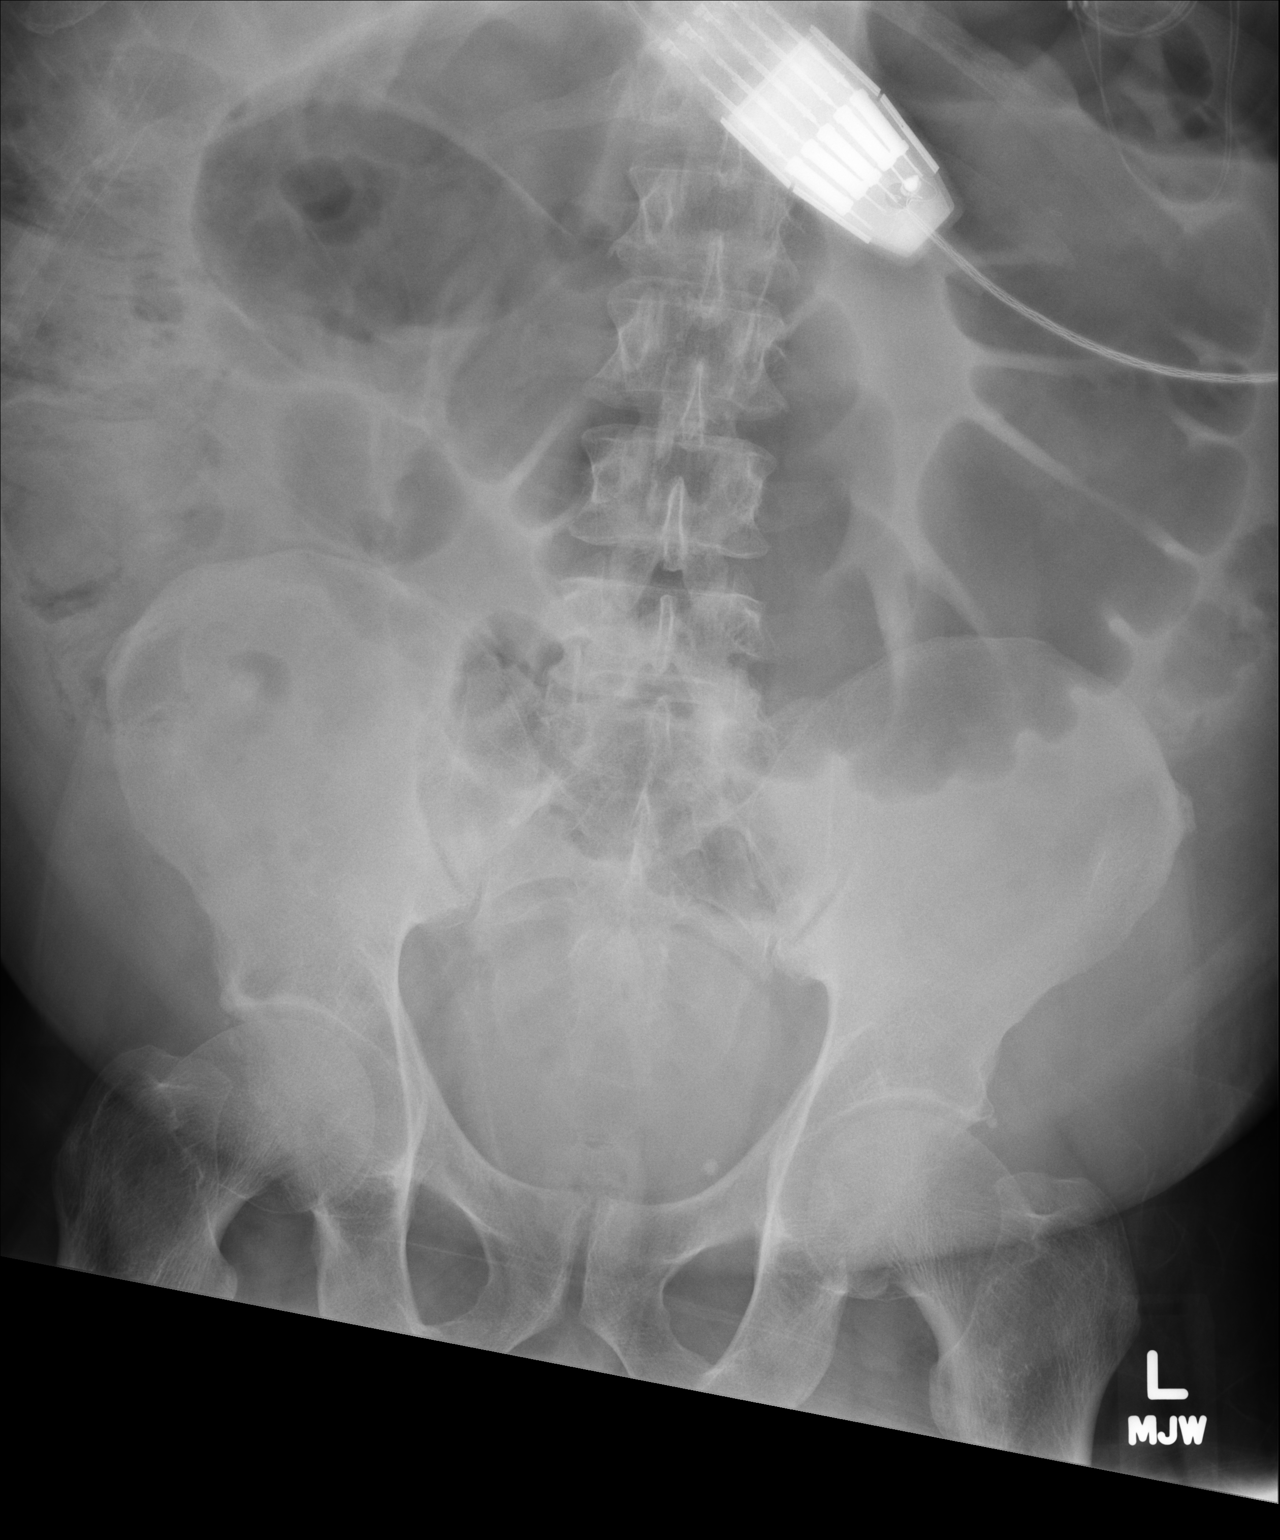

[1 of 1 positions shown; findings below may reference images not displayed]

FINDINGS: Right colon with appearance suggesting linear gas collection within
stool within the right colon. This limits evaluation for possibility
of pneumatosis. If this were of concern then CT imaging may be
considered.

Gas distended transverse colon measuring up to 9.2 cm. Paucity of
distal colonic gas.

No gas distended small bowel loops.

Overall appearance may represent ileus. Distal obstructing lesion
not entirely excluded.

The possibility of free intraperitoneal air cannot be assessed on a
supine view.
IMPRESSION: No significant change with gas pattern which may represent colonic
ileus. Please see above discussion.

## 2016-02-19 DIAGNOSIS — N2581 Secondary hyperparathyroidism of renal origin: Secondary | ICD-10-CM | POA: Diagnosis not present

## 2016-02-19 DIAGNOSIS — D631 Anemia in chronic kidney disease: Secondary | ICD-10-CM | POA: Diagnosis not present

## 2016-02-19 DIAGNOSIS — N183 Chronic kidney disease, stage 3 (moderate): Secondary | ICD-10-CM | POA: Diagnosis not present

## 2016-02-19 DIAGNOSIS — I129 Hypertensive chronic kidney disease with stage 1 through stage 4 chronic kidney disease, or unspecified chronic kidney disease: Secondary | ICD-10-CM | POA: Diagnosis not present

## 2016-02-20 ENCOUNTER — Other Ambulatory Visit: Payer: Self-pay | Admitting: Nephrology

## 2016-02-20 DIAGNOSIS — N183 Chronic kidney disease, stage 3 unspecified: Secondary | ICD-10-CM

## 2016-02-24 ENCOUNTER — Ambulatory Visit
Admission: RE | Admit: 2016-02-24 | Discharge: 2016-02-24 | Disposition: A | Discharge status: Home / Self Care | Payer: BLUE CROSS/BLUE SHIELD | Source: Ambulatory Visit | Attending: Nephrology | Admitting: Nephrology

## 2016-02-24 DIAGNOSIS — N183 Chronic kidney disease, stage 3 unspecified: Secondary | ICD-10-CM

## 2016-02-24 DIAGNOSIS — N189 Chronic kidney disease, unspecified: Secondary | ICD-10-CM | POA: Diagnosis not present

## 2016-04-02 DIAGNOSIS — I129 Hypertensive chronic kidney disease with stage 1 through stage 4 chronic kidney disease, or unspecified chronic kidney disease: Secondary | ICD-10-CM | POA: Diagnosis not present

## 2016-04-02 DIAGNOSIS — N2581 Secondary hyperparathyroidism of renal origin: Secondary | ICD-10-CM | POA: Diagnosis not present

## 2016-04-02 DIAGNOSIS — N183 Chronic kidney disease, stage 3 (moderate): Secondary | ICD-10-CM | POA: Diagnosis not present

## 2016-04-02 DIAGNOSIS — D631 Anemia in chronic kidney disease: Secondary | ICD-10-CM | POA: Diagnosis not present

## 2016-07-20 DIAGNOSIS — N184 Chronic kidney disease, stage 4 (severe): Secondary | ICD-10-CM | POA: Diagnosis not present

## 2016-07-20 DIAGNOSIS — N2581 Secondary hyperparathyroidism of renal origin: Secondary | ICD-10-CM | POA: Diagnosis not present

## 2016-07-20 DIAGNOSIS — D631 Anemia in chronic kidney disease: Secondary | ICD-10-CM | POA: Diagnosis not present

## 2016-07-20 DIAGNOSIS — I129 Hypertensive chronic kidney disease with stage 1 through stage 4 chronic kidney disease, or unspecified chronic kidney disease: Secondary | ICD-10-CM | POA: Diagnosis not present

## 2016-08-25 DIAGNOSIS — N184 Chronic kidney disease, stage 4 (severe): Secondary | ICD-10-CM | POA: Diagnosis not present

## 2016-08-25 DIAGNOSIS — N2581 Secondary hyperparathyroidism of renal origin: Secondary | ICD-10-CM | POA: Diagnosis not present

## 2016-08-25 DIAGNOSIS — I129 Hypertensive chronic kidney disease with stage 1 through stage 4 chronic kidney disease, or unspecified chronic kidney disease: Secondary | ICD-10-CM | POA: Diagnosis not present

## 2016-08-25 DIAGNOSIS — D631 Anemia in chronic kidney disease: Secondary | ICD-10-CM | POA: Diagnosis not present

## 2016-08-27 ENCOUNTER — Ambulatory Visit
Admission: RE | Admit: 2016-08-27 | Discharge: 2016-08-27 | Disposition: A | Discharge status: Home / Self Care | Payer: BLUE CROSS/BLUE SHIELD | Source: Ambulatory Visit | Attending: Family Medicine | Admitting: Family Medicine

## 2016-08-27 ENCOUNTER — Other Ambulatory Visit: Payer: Self-pay | Admitting: Family Medicine

## 2016-08-27 DIAGNOSIS — M25552 Pain in left hip: Secondary | ICD-10-CM | POA: Diagnosis not present

## 2016-08-27 DIAGNOSIS — M25562 Pain in left knee: Secondary | ICD-10-CM

## 2016-11-24 DIAGNOSIS — N184 Chronic kidney disease, stage 4 (severe): Secondary | ICD-10-CM | POA: Diagnosis not present

## 2016-11-24 DIAGNOSIS — D631 Anemia in chronic kidney disease: Secondary | ICD-10-CM | POA: Diagnosis not present

## 2016-11-24 DIAGNOSIS — I129 Hypertensive chronic kidney disease with stage 1 through stage 4 chronic kidney disease, or unspecified chronic kidney disease: Secondary | ICD-10-CM | POA: Diagnosis not present

## 2016-11-24 DIAGNOSIS — N2581 Secondary hyperparathyroidism of renal origin: Secondary | ICD-10-CM | POA: Diagnosis not present

## 2017-02-16 DIAGNOSIS — I129 Hypertensive chronic kidney disease with stage 1 through stage 4 chronic kidney disease, or unspecified chronic kidney disease: Secondary | ICD-10-CM | POA: Diagnosis not present

## 2017-02-16 DIAGNOSIS — D631 Anemia in chronic kidney disease: Secondary | ICD-10-CM | POA: Diagnosis not present

## 2017-02-16 DIAGNOSIS — N184 Chronic kidney disease, stage 4 (severe): Secondary | ICD-10-CM | POA: Diagnosis not present

## 2017-02-16 DIAGNOSIS — N2581 Secondary hyperparathyroidism of renal origin: Secondary | ICD-10-CM | POA: Diagnosis not present

## 2017-06-22 DIAGNOSIS — N184 Chronic kidney disease, stage 4 (severe): Secondary | ICD-10-CM | POA: Diagnosis not present

## 2017-06-22 DIAGNOSIS — I129 Hypertensive chronic kidney disease with stage 1 through stage 4 chronic kidney disease, or unspecified chronic kidney disease: Secondary | ICD-10-CM | POA: Diagnosis not present

## 2017-06-22 DIAGNOSIS — D631 Anemia in chronic kidney disease: Secondary | ICD-10-CM | POA: Diagnosis not present

## 2017-06-22 DIAGNOSIS — N2581 Secondary hyperparathyroidism of renal origin: Secondary | ICD-10-CM | POA: Diagnosis not present

## 2017-11-22 DIAGNOSIS — N2581 Secondary hyperparathyroidism of renal origin: Secondary | ICD-10-CM | POA: Diagnosis not present

## 2017-11-22 DIAGNOSIS — D631 Anemia in chronic kidney disease: Secondary | ICD-10-CM | POA: Diagnosis not present

## 2017-11-22 DIAGNOSIS — I129 Hypertensive chronic kidney disease with stage 1 through stage 4 chronic kidney disease, or unspecified chronic kidney disease: Secondary | ICD-10-CM | POA: Diagnosis not present

## 2017-11-22 DIAGNOSIS — N184 Chronic kidney disease, stage 4 (severe): Secondary | ICD-10-CM | POA: Diagnosis not present

## 2018-03-16 DIAGNOSIS — N2581 Secondary hyperparathyroidism of renal origin: Secondary | ICD-10-CM | POA: Diagnosis not present

## 2018-03-16 DIAGNOSIS — D631 Anemia in chronic kidney disease: Secondary | ICD-10-CM | POA: Diagnosis not present

## 2018-03-16 DIAGNOSIS — I129 Hypertensive chronic kidney disease with stage 1 through stage 4 chronic kidney disease, or unspecified chronic kidney disease: Secondary | ICD-10-CM | POA: Diagnosis not present

## 2018-03-16 DIAGNOSIS — N184 Chronic kidney disease, stage 4 (severe): Secondary | ICD-10-CM | POA: Diagnosis not present

## 2018-04-27 ENCOUNTER — Telehealth: Payer: Self-pay

## 2018-04-27 NOTE — Telephone Encounter (Signed)
Notes sent to [facility], Dr. Martinique patient.

## 2018-07-18 DIAGNOSIS — D631 Anemia in chronic kidney disease: Secondary | ICD-10-CM | POA: Diagnosis not present

## 2018-07-18 DIAGNOSIS — I129 Hypertensive chronic kidney disease with stage 1 through stage 4 chronic kidney disease, or unspecified chronic kidney disease: Secondary | ICD-10-CM | POA: Diagnosis not present

## 2018-07-18 DIAGNOSIS — N2581 Secondary hyperparathyroidism of renal origin: Secondary | ICD-10-CM | POA: Diagnosis not present

## 2018-07-18 DIAGNOSIS — N184 Chronic kidney disease, stage 4 (severe): Secondary | ICD-10-CM | POA: Diagnosis not present

## 2018-12-01 DIAGNOSIS — N184 Chronic kidney disease, stage 4 (severe): Secondary | ICD-10-CM | POA: Diagnosis not present

## 2018-12-01 DIAGNOSIS — I129 Hypertensive chronic kidney disease with stage 1 through stage 4 chronic kidney disease, or unspecified chronic kidney disease: Secondary | ICD-10-CM | POA: Diagnosis not present

## 2018-12-01 DIAGNOSIS — N2581 Secondary hyperparathyroidism of renal origin: Secondary | ICD-10-CM | POA: Diagnosis not present

## 2018-12-01 DIAGNOSIS — D631 Anemia in chronic kidney disease: Secondary | ICD-10-CM | POA: Diagnosis not present

## 2019-02-09 DIAGNOSIS — N2581 Secondary hyperparathyroidism of renal origin: Secondary | ICD-10-CM | POA: Diagnosis not present

## 2019-02-09 DIAGNOSIS — I129 Hypertensive chronic kidney disease with stage 1 through stage 4 chronic kidney disease, or unspecified chronic kidney disease: Secondary | ICD-10-CM | POA: Diagnosis not present

## 2019-02-09 DIAGNOSIS — D631 Anemia in chronic kidney disease: Secondary | ICD-10-CM | POA: Diagnosis not present

## 2019-02-09 DIAGNOSIS — N184 Chronic kidney disease, stage 4 (severe): Secondary | ICD-10-CM | POA: Diagnosis not present

## 2019-06-08 DIAGNOSIS — I129 Hypertensive chronic kidney disease with stage 1 through stage 4 chronic kidney disease, or unspecified chronic kidney disease: Secondary | ICD-10-CM | POA: Diagnosis not present

## 2019-06-08 DIAGNOSIS — D631 Anemia in chronic kidney disease: Secondary | ICD-10-CM | POA: Diagnosis not present

## 2019-06-08 DIAGNOSIS — N2581 Secondary hyperparathyroidism of renal origin: Secondary | ICD-10-CM | POA: Diagnosis not present

## 2019-06-08 DIAGNOSIS — N184 Chronic kidney disease, stage 4 (severe): Secondary | ICD-10-CM | POA: Diagnosis not present

## 2019-09-12 NOTE — Progress Notes (Signed)
CARDIOLOGY CONSULT NOTE       Patient ID: Michael Levy MRN: [id number] DOB/AGE: 07/28/1966 53 y.o.  Admit date: (Not on file) Referring Physician: Laural Benes Primary Physician: Patient, No Pcp Per Primary Cardiologist: new Reason for Consultation: CAD  Active Problems:   * No active hospital problems. *   HPI:  53 y.o. previously seen by Dr Martinique.  History of MI at age 44 at [facility]. Stents to RCA/PDA 2010 Active smoker Had recurrent chest pain August 2014 cath by Dr Martinique. 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 Anemia    Unknown cause  . Anginal pain ([facility])   . Arthritis   . Atherosclerosis 06/2013   per CT age-advanced atherosclerosis of the aorta, great vessels and coronaries.  . Atrial fibrillation ([facility])    Postoperative  . Bilateral renal cysts 07/2013   per renal ultrasound   . Chronic kidney disease (CKD), stage II (mild)    scored as stage 3 in 07/2013 with acute on chronic renal failure post CABG  . Emphysema 2014  . Gout   . Headache(784.0)    occasional migraines  . Heart attack ([facility]) 2005   One stent placed; 2 years later had 4 more stents placed  . Hyperlipidemia   . Hypertension    Essential  . Periodontal disease   . Rapid heart rate     Family History  Problem Relation Age of Onset  . Diabetes Mother   . Hyperlipidemia Mother   .  Hypertension Mother   . Coronary artery disease Mother 60       CABG at age 36  . Cancer Father   . Hyperlipidemia Father   . Hypertension Father   . Stroke Father   . Diabetes Maternal Grandmother     Social History   Socioeconomic History  . Marital status: Single    Spouse name: Not on file  . Number of children: 0  . Years of education: Not on file  . Highest education level: Not on file  Occupational History  . Occupation: Leisure centre manager: Pension scheme manager [employer]  Social Needs  . Financial resource strain: Not on file  . Food insecurity    Worry: Not on file    Inability: Not on file  . Transportation needs    Medical: Not on file    Non-medical: Not on file  Tobacco Use  . Smoking status: Current Every Day Smoker    Packs/day: 2.00    Years: 36.00    Pack years: 72.00    Types: Cigarettes    Start date: 11/02/1977  . Smokeless tobacco: Former Network engineer and Sexual Activity  . Alcohol use: Yes    Comment: 2 beers once a month  . Drug use: No    Comment:  formerly  . Sexual activity: Not on file  Lifestyle  . Physical activity    Days per week: Not on file    Minutes per session: Not on file  . Stress: Not on file  Relationships  . Social Herbalist on phone: Not on file    Gets together: Not on file    Attends religious service: Not on file    Active member of club or organization: Not on file    Attends meetings of clubs or organizations: Not on file    Relationship status: Not on file  . Intimate partner violence    Fear of current or ex partner: Not on file    Emotionally abused: Not on file    Physically abused: Not on file    Forced sexual activity: Not on file  Other Topics Concern  . Not on file  Social History Narrative  . Not on file    Past Surgical History:  Procedure Laterality Date  . CARDIAC CATHETERIZATION     06/30/13 Dr Martinique  . CORONARY ANGIOPLASTY WITH STENT PLACEMENT    . CORONARY ARTERY BYPASS  GRAFT N/A 07/14/2013   Procedure: CORONARY ARTERY BYPASS GRAFTING (CABG);  Surgeon: Melrose Nakayama, MD;  Location: [facility];  Service: Open Heart Surgery;  Laterality: N/A;  . LEFT HEART CATHETERIZATION WITH CORONARY ANGIOGRAM N/A 06/30/2013   Procedure: LEFT HEART CATHETERIZATION WITH CORONARY ANGIOGRAM;  Surgeon: Pius Byrom M Martinique, MD;  Location[facility] C[facility] LAB;  Service: Cardiovascular;  Laterality: N/A;  . RADIAL ARTERY HARVEST Left 07/14/2013   Procedure: RADIAL ARTERY HARVEST;  Surgeon: Melrose Nakayama, MD;  Location: [facility];  Service: Vascular;  Laterality: Left;  . TONSILLECTOMY          Physical Exam: Blood pressure (!) 142/82, pulse 61, height 5\' 9"  (1.753 m), weight 195 lb (88.5 kg), SpO2 98 %.   Affect appropriate Healthy:  appears stated age 53: normal Neck supple with no adenopathy JVP normal bilateral  bruits no thyromegaly Lungs clear with no wheezing and good diaphragmatic motion Heart:  S1/S2 no murmur, no rub, gallop or click PMI normal post sternotomy  Abdomen: benighn, BS positve, no tenderness, no AAA no bruit.  No HSM or HJR Previous left radial harvest for CABG  No edema Neuro non-focal Skin warm and dry No muscular weakness   Labs:   Lab Results  Component Value Date   WBC 15.9 (H) 07/24/2013   HGB 8.0 (L) 07/24/2013   HCT 23.7 (L) 07/24/2013   MCV 88.4 07/24/2013   PLT 361 07/24/2013   No results for input(s): NA, K, CL, CO2, BUN, CREATININE, CALCIUM, PROT, BILITOT, ALKPHOS, ALT, AST, GLUCOSE in the last 168 hours.  Invalid input(s): LABALBU Lab Results  Component Value Date   CKTOTAL 2,934 (H) 07/15/2013     Radiology: No results found.  EKG: 2014 SR RBBB old IMI  09/14/19 SR rate 45 RBBB old IMI  No changes    ASSESSMENT AND PLAN:   1. CAD with previous stents to RCA 2010 subsequent CABG 2014 with LIMA to LAD, radial to OM and SVG to PDA/PLB no angina continue statin asa and beta blocker  2. Reduced EF:  EF estimated 45% in past  will update echo  3. PV/Carotid:  Moderate bilateral ICA stenosis on pre cabg duplex 2014 will update  4. Smoking:  Counseled on cessation for less than 10 minutes has failed Chantix before Lung cancer screening CT next July when  he turns 53  5. HTN:  Well controlled.  Continue current medications and low sodium Dash type diet.   6. HLD  On statin labs with primary target LDL 70 or less    Signed: Jenkins Rouge 09/14/2019, 3:08 PM

## 2019-09-14 ENCOUNTER — Other Ambulatory Visit: Payer: Self-pay

## 2019-09-14 ENCOUNTER — Ambulatory Visit (INDEPENDENT_AMBULATORY_CARE_PROVIDER_SITE_OTHER): Payer: BC Managed Care – PPO | Admitting: Cardiovascular Disease

## 2019-09-14 ENCOUNTER — Encounter: Payer: Self-pay | Admitting: Cardiovascular Disease

## 2019-09-14 ENCOUNTER — Ambulatory Visit: Payer: BC Managed Care – PPO | Admitting: Cardiovascular Disease

## 2019-09-14 VITALS — BP 142/82 | HR 61 | Ht 69.0 in | Wt 195.0 lb

## 2019-09-14 DIAGNOSIS — I779 Disorder of arteries and arterioles, unspecified: Secondary | ICD-10-CM

## 2019-09-14 DIAGNOSIS — I129 Hypertensive chronic kidney disease with stage 1 through stage 4 chronic kidney disease, or unspecified chronic kidney disease: Secondary | ICD-10-CM | POA: Diagnosis not present

## 2019-09-14 DIAGNOSIS — I2510 Atherosclerotic heart disease of native coronary artery without angina pectoris: Secondary | ICD-10-CM | POA: Diagnosis not present

## 2019-09-14 DIAGNOSIS — N2581 Secondary hyperparathyroidism of renal origin: Secondary | ICD-10-CM | POA: Diagnosis not present

## 2019-09-14 DIAGNOSIS — Z87891 Personal history of nicotine dependence: Secondary | ICD-10-CM

## 2019-09-14 DIAGNOSIS — Z720 Tobacco use: Secondary | ICD-10-CM | POA: Diagnosis not present

## 2019-09-14 DIAGNOSIS — R931 Abnormal findings on diagnostic imaging of heart and coronary circulation: Secondary | ICD-10-CM

## 2019-09-14 DIAGNOSIS — D631 Anemia in chronic kidney disease: Secondary | ICD-10-CM | POA: Diagnosis not present

## 2019-09-14 DIAGNOSIS — N184 Chronic kidney disease, stage 4 (severe): Secondary | ICD-10-CM | POA: Diagnosis not present

## 2019-09-14 MED ORDER — ASPIRIN EC 81 MG PO TBEC: 81.0000 mg | DELAYED_RELEASE_TABLET | Freq: Every day | ORAL | 3 refills | Status: DC

## 2019-09-14 NOTE — Patient Instructions (Addendum)
Medication Instructions:  Your physician has recommended you make the following change in your medication:  1-STOP Aspirin 325 mg 2-START Aspirin 81 mg tablet by mouth daily  *If you need a refill on your cardiac medications before your next appointment, please call your pharmacy*  Lab Work:  If you have labs (blood work) drawn today and your tests are completely normal, you will receive your results only by: Marland Kitchen MyChart Message (if you have MyChart) OR . A paper copy in the mail If you have any lab test that is abnormal or we need to change your treatment, we will call you to review the results.  Testing/Procedures: Non-Cardiac CT scanning for lung cancer screening, (CAT scanning), is a noninvasive, special x-ray that produces cross-sectional images of the body using x-rays and a computer. CT scans help physicians diagnose and treat medical conditions. For some CT exams, a contrast material is used to enhance visibility in the area of the body being studied. CT scans provide greater clarity and reveal more details than regular x-ray exams.  Your physician has requested that you have an echocardiogram. Echocardiography is a painless test that uses sound waves to create images of your heart. It provides your doctor with information about the size and shape of your heart and how well your heart's chambers and valves are working. This procedure takes approximately one hour. There are no restrictions for this procedure.  Your physician has requested that you have a carotid duplex. This test is an ultrasound of the carotid arteries in your neck. It looks at blood flow through these arteries that supply the brain with blood. Allow one hour for this exam. There are no restrictions or special instructions.  Follow-Up: At [facility], you and your health needs are our priority.  As part of our continuing mission to provide you with exceptional heart care, we have created designated Provider Care Teams.   These Care Teams include your primary Cardiologist (physician) and Advanced Practice Providers (APPs -  Physician Assistants and Nurse Practitioners) who all work together to provide you with the care you need, when you need it.  Your next appointment:   12 months  The format for your next appointment:   In Person  Provider:   You may see Dr. Johnsie Cancel or one of the following Advanced Practice Providers on your designated Care Team:    Truitt Merle, NP  Cecilie Kicks, NP  Kathyrn Drown, NP

## 2019-09-25 ENCOUNTER — Other Ambulatory Visit: Payer: Self-pay

## 2019-09-25 ENCOUNTER — Ambulatory Visit (HOSPITAL_COMMUNITY): Payer: BC Managed Care – PPO

## 2019-10-02 ENCOUNTER — Other Ambulatory Visit: Payer: Self-pay

## 2019-10-02 ENCOUNTER — Ambulatory Visit (HOSPITAL_COMMUNITY)
Admission: RE | Admit: 2019-10-02 | Discharge: 2019-10-02 | Disposition: A | Discharge status: Home / Self Care | Payer: BC Managed Care – PPO | Source: Ambulatory Visit | Attending: Internal Medicine | Admitting: Internal Medicine

## 2019-10-02 ENCOUNTER — Other Ambulatory Visit: Payer: Self-pay | Admitting: Cardiovascular Disease

## 2019-10-02 DIAGNOSIS — I779 Disorder of arteries and arterioles, unspecified: Secondary | ICD-10-CM

## 2019-10-02 DIAGNOSIS — R931 Abnormal findings on diagnostic imaging of heart and coronary circulation: Secondary | ICD-10-CM

## 2019-10-02 DIAGNOSIS — I6523 Occlusion and stenosis of bilateral carotid arteries: Secondary | ICD-10-CM

## 2019-10-09 ENCOUNTER — Other Ambulatory Visit: Payer: Self-pay

## 2019-10-09 ENCOUNTER — Ambulatory Visit (HOSPITAL_COMMUNITY): Payer: BC Managed Care – PPO | Attending: Cardiology

## 2019-10-09 DIAGNOSIS — I2510 Atherosclerotic heart disease of native coronary artery without angina pectoris: Secondary | ICD-10-CM | POA: Diagnosis not present

## 2019-10-11 ENCOUNTER — Telehealth: Payer: Self-pay

## 2019-10-11 DIAGNOSIS — I6523 Occlusion and stenosis of bilateral carotid arteries: Secondary | ICD-10-CM

## 2019-10-11 NOTE — Telephone Encounter (Signed)
Patient aware of results. Will place order for 6 month carotids.

## 2019-10-11 NOTE — Telephone Encounter (Signed)
-----   Message from Josue Hector, MD sent at 10/03/2019  7:33 AM EST ----- 60-79% RICA stenosis.  F/U carotid duplex in 6 months Left ICA 60-79% stenosis.  No TIA symptoms.  Continue antiplatelet Rx and F/U carotid duplex in 6 months

## 2020-01-31 ENCOUNTER — Other Ambulatory Visit (HOSPITAL_COMMUNITY): Payer: Self-pay | Admitting: *Deleted

## 2020-02-01 ENCOUNTER — Other Ambulatory Visit: Payer: Self-pay

## 2020-02-01 ENCOUNTER — Ambulatory Visit (HOSPITAL_COMMUNITY)
Admission: RE | Admit: 2020-02-01 | Discharge: 2020-02-01 | Disposition: A | Discharge status: Home / Self Care | Payer: Self-pay | Source: Ambulatory Visit | Attending: Nephrology | Admitting: Nephrology

## 2020-02-01 DIAGNOSIS — D631 Anemia in chronic kidney disease: Secondary | ICD-10-CM | POA: Insufficient documentation

## 2020-02-01 MED ORDER — SODIUM CHLORIDE 0.9 % IV SOLN
510.0000 mg | INTRAVENOUS | Status: DC
  Administered 2020-02-01: 510 mg via INTRAVENOUS
  Filled 2020-02-01: qty 17

## 2020-02-01 NOTE — Discharge Instructions (Signed)

## 2020-02-07 ENCOUNTER — Other Ambulatory Visit: Payer: Self-pay | Admitting: Obstetrics and Gynecology

## 2020-02-07 DIAGNOSIS — Z13820 Encounter for screening for osteoporosis: Secondary | ICD-10-CM

## 2020-02-07 DIAGNOSIS — N184 Chronic kidney disease, stage 4 (severe): Secondary | ICD-10-CM

## 2020-02-08 ENCOUNTER — Encounter (HOSPITAL_COMMUNITY): Payer: Self-pay

## 2020-02-09 ENCOUNTER — Encounter (HOSPITAL_COMMUNITY)
Admission: RE | Admit: 2020-02-09 | Discharge: 2020-02-09 | Disposition: A | Discharge status: Home / Self Care | Payer: Self-pay | Source: Ambulatory Visit | Attending: Nephrology | Admitting: Nephrology

## 2020-02-09 DIAGNOSIS — D631 Anemia in chronic kidney disease: Secondary | ICD-10-CM | POA: Insufficient documentation

## 2020-02-09 DIAGNOSIS — N189 Chronic kidney disease, unspecified: Secondary | ICD-10-CM | POA: Insufficient documentation

## 2020-02-09 MED ORDER — SODIUM CHLORIDE 0.9 % IV SOLN
510.0000 mg | INTRAVENOUS | Status: DC
  Administered 2020-02-09: 09:00:00 510 mg via INTRAVENOUS
  Filled 2020-02-09: qty 17

## 2020-05-07 ENCOUNTER — Inpatient Hospital Stay: Admission: RE | Admit: 2020-05-07 | Payer: BC Managed Care – PPO | Source: Ambulatory Visit

## 2020-10-01 ENCOUNTER — Ambulatory Visit (HOSPITAL_COMMUNITY)
Admission: RE | Admit: 2020-10-01 | Discharge: 2020-10-01 | Disposition: A | Discharge status: Home / Self Care | Payer: BC Managed Care – PPO | Source: Ambulatory Visit | Attending: Internal Medicine | Admitting: Internal Medicine

## 2020-10-01 ENCOUNTER — Other Ambulatory Visit: Payer: Self-pay

## 2020-10-01 DIAGNOSIS — I779 Disorder of arteries and arterioles, unspecified: Secondary | ICD-10-CM

## 2020-10-01 DIAGNOSIS — R9439 Abnormal result of other cardiovascular function study: Secondary | ICD-10-CM

## 2020-10-01 DIAGNOSIS — I6523 Occlusion and stenosis of bilateral carotid arteries: Secondary | ICD-10-CM | POA: Diagnosis not present

## 2020-10-01 NOTE — Progress Notes (Signed)
Per Dr. Johnsie Cancel needs an appointment with [facility] to discuss need for carotid endarterectomy. Put in referral for [facility]. Will send message to [facility] to help schedule.

## 2020-10-07 ENCOUNTER — Other Ambulatory Visit: Payer: Self-pay | Admitting: *Deleted

## 2020-10-07 DIAGNOSIS — N189 Chronic kidney disease, unspecified: Secondary | ICD-10-CM

## 2020-10-16 ENCOUNTER — Other Ambulatory Visit (HOSPITAL_COMMUNITY): Payer: Self-pay | Admitting: Vascular Surgery

## 2020-10-16 ENCOUNTER — Ambulatory Visit (HOSPITAL_COMMUNITY)
Admission: RE | Admit: 2020-10-16 | Discharge: 2020-10-16 | Disposition: A | Discharge status: Home / Self Care | Payer: BC Managed Care – PPO | Source: Ambulatory Visit | Attending: Vascular Surgery | Admitting: Vascular Surgery

## 2020-10-16 ENCOUNTER — Encounter: Payer: Self-pay | Admitting: Vascular Surgery

## 2020-10-16 ENCOUNTER — Other Ambulatory Visit: Payer: Self-pay

## 2020-10-16 ENCOUNTER — Ambulatory Visit (INDEPENDENT_AMBULATORY_CARE_PROVIDER_SITE_OTHER)
Admission: RE | Admit: 2020-10-16 | Discharge: 2020-10-16 | Disposition: A | Discharge status: Home / Self Care | Payer: BC Managed Care – PPO | Source: Ambulatory Visit | Attending: Vascular Surgery | Admitting: Vascular Surgery

## 2020-10-16 ENCOUNTER — Ambulatory Visit: Payer: BC Managed Care – PPO | Admitting: Vascular Surgery

## 2020-10-16 VITALS — BP 158/79 | HR 58 | Temp 98.6°F | Resp 20 | Ht 69.0 in | Wt 204.0 lb

## 2020-10-16 DIAGNOSIS — I6521 Occlusion and stenosis of right carotid artery: Secondary | ICD-10-CM | POA: Insufficient documentation

## 2020-10-16 DIAGNOSIS — N189 Chronic kidney disease, unspecified: Secondary | ICD-10-CM | POA: Insufficient documentation

## 2020-10-16 DIAGNOSIS — I6523 Occlusion and stenosis of bilateral carotid arteries: Secondary | ICD-10-CM

## 2020-10-16 NOTE — Progress Notes (Signed)
REASON FOR CONSULT:    The patient is referred by Dr. Posey Pronto for evaluation for hemodialysis access.  The patient is also referred by Dr. Johnsie Cancel with a greater than 80% right carotid stenosis.  ASSESSMENT & PLAN:   CAROTID DISEASE: This patient has asymptomatic bilateral 60 to 79% stenoses.  I explained we would not consider carotid endarterectomy less the stenosis progressed to greater than 80%.  I recommended a follow-up carotid duplex scan in 6 months and I will see him back at that time.  Given his cardiac history he could potentially be considered for transcarotid stenting however I do not want to obtain a CT angiogram of the neck given his chronic kidney disease.  If he ultimately ends up on dialysis then this would be an option.  He is on aspirin and is on a statin.  We have discussed the importance of tobacco cessation.  We have also reviewed the symptoms of cerebrovascular disease.  STAGE IV CHRONIC KIDNEY DISEASE: Based on the vein map the best option for a fistula would be a right brachiocephalic fistula.  I would also favor access in the right arm given that the patient has had his left radial artery harvested for coronary revascularization.  We were asked not to place an AV graft if a fistula were not possible.  I discussed the indications for placement of a fistula potential complications and he is agreeable to proceed.  He would like to do this on a Friday so he does not need to miss work and can have a few days before going back to work.  This has been scheduled for 11/08/2020.   Deitra Mayo, MD Office: [phone]   HPI:   Michael Levy is a pleasant 54 y.o. male, who was referred for evaluation for hemodialysis access.  He was also referred for evaluation of carotid disease.  I reviewed the note from Dr. Johnsie Cancel.  He has been followed by him with bilateral carotid disease and on his most recent duplex scan in November the stenosis on the l right had progressed to greater  than 80% and he was sent for vascular consultation.  On his last note his hypertension was under good control.  His hyperlipidemia was under good control.  He does have a history of coronary artery disease and has had previous stents.  I also reviewed the records from [facility].  The patient was noted to have stage V chronic kidney disease.  We were asked to place an AV fistula and not place an AV graft if the fistula was not possible.  The patient has stage IV chronic kidney disease.  He was last seen on 09/20/2020.  He also has hypertension, anemia of renal disease, and secondary hyperparathyroidism.  On my history, the patient denies any history of stroke, TIAs, expressive or receptive aphasia, or amaurosis fugax.  He is right-handed.  His risk factors for peripheral vascular disease include hypertension, hypercholesterolemia, and tobacco use.  He smokes a half a pack per day and has been smoking for 36 years.  He denies any history of diabetes or family history of premature cardiovascular disease.  He is on aspirin and is on a statin.  Past Medical History:  Diagnosis Date  . Anemia    Unknown cause  . Anginal pain ([place])   . Arthritis   . Atherosclerosis 06/2013   per CT age-advanced atherosclerosis of the aorta, great vessels and coronaries.  . Atrial fibrillation (HCC)    Postoperative  .  Bilateral renal cysts 07/2013   per renal ultrasound   . Carotid artery occlusion   . Chronic kidney disease (CKD), stage II (mild)    scored as stage 3 in 07/2013 with acute on chronic renal failure post CABG  . Emphysema 2014  . Gout   . Headache(784.0)    occasional migraines  . Heart attack ([place]) 2005   One stent placed; 2 years later had 4 more stents placed  . Hyperlipidemia   . Hypertension    Essential  . Periodontal disease   . Rapid heart rate     Family History  Problem Relation Age of Onset  . Diabetes Mother   . Hyperlipidemia Mother   . Hypertension Mother    . Coronary artery disease Mother 51       CABG at age 6  . Cancer Father   . Hyperlipidemia Father   . Hypertension Father   . Stroke Father   . Diabetes Maternal Grandmother     SOCIAL HISTORY: Social History   Socioeconomic History  . Marital status: Single    Spouse name: Not on file  . Number of children: 0  . Years of education: Not on file  . Highest education level: Not on file  Occupational History  . Occupation: Leisure centre manager: [employer]  Tobacco Use  . Smoking status: Current Every Day Smoker    Packs/day: 0.50    Years: 36.00    Pack years: 18.00    Types: Cigarettes    Start date: 11/02/1977  . Smokeless tobacco: Former Network engineer  . Vaping Use: Never used  Substance and Sexual Activity  . Alcohol use: Yes    Comment: 2 beers once a month  . Drug use: No    Comment: formerly  . Sexual activity: Not on file  Other Topics Concern  . Not on file  Social History Narrative  . Not on file   Social Determinants of Health   Financial Resource Strain: Not on file  Food Insecurity: Not on file  Transportation Needs: Not on file  Physical Activity: Not on file  Stress: Not on file  Social Connections: Not on file  Intimate Partner Violence: Not on file    Allergies  Allergen Reactions  . Allopurinol     Kidney Failure    Current Outpatient Medications  Medication Sig Dispense Refill  . amLODipine (NORVASC) 2.5 MG tablet Take 1 tablet (2.5 mg total) by mouth daily. 30 tablet 1  . aspirin EC 81 MG tablet Take 1 tablet (81 mg total) by mouth daily. 90 tablet 3  . atorvastatin (LIPITOR) 40 MG tablet Take 1 tablet (40 mg total) by mouth daily. 30 tablet 6  . colchicine 0.6 MG tablet Take 1 tablet (0.6 mg total) by mouth daily. 5 tablet 0  . furosemide (LASIX) 80 MG tablet Take 0.5 tablets (40 mg total) by mouth daily. 30 tablet 6  . isosorbide mononitrate (IMDUR) 30 MG 24 hr tablet Take 1 tablet (30 mg total) by mouth  daily. 30 tablet 0  . metoprolol tartrate (LOPRESSOR) 25 MG tablet Take 1 tablet (25 mg total) by mouth 2 (two) times daily. 60 tablet 1  . zolpidem (AMBIEN) 5 MG tablet      No current facility-administered medications for this visit.    REVIEW OF SYSTEMS:  [X]  denotes positive finding, [ ]  denotes negative finding Cardiac  Comments:  Chest pain or chest pressure:  Shortness of breath upon exertion: x   Short of breath when lying flat:    Irregular heart rhythm:        Vascular    Pain in calf, thigh, or hip brought on by ambulation: x   Pain in feet at night that wakes you up from your sleep:     Blood clot in your veins:    Leg swelling:         Pulmonary    Oxygen at home:    Productive cough:     Wheezing:         Neurologic    Sudden weakness in arms or legs:     Sudden numbness in arms or legs:     Sudden onset of difficulty speaking or slurred speech:    Temporary loss of vision in one eye:     Problems with dizziness:         Gastrointestinal    Blood in stool:     Vomited blood:         Genitourinary    Burning when urinating:     Blood in urine:        Psychiatric    Major depression:         Hematologic    Bleeding problems:    Problems with blood clotting too easily:        Skin    Rashes or ulcers:        Constitutional    Fever or chills:     PHYSICAL EXAM:   Vitals:   10/16/20 1005 10/16/20 1008  BP: (!) 154/78 (!) 158/79  Pulse: (!) 58   Resp: 20   Temp: 98.6 F (37 C)   SpO2: 96%   Weight: 204 lb (92.5 kg)   Height: 5\' 9"  (1.753 m)    Body mass index is 30.13 kg/m.  GENERAL: The patient is a well-nourished male, in no acute distress. The vital signs are documented above. CARDIAC: There is a regular rate and rhythm.  VASCULAR: He has a right carotid bruit. He has palpable femoral pulses and palpable dorsalis pedis pulses bilaterally. PULMONARY: There is good air exchange bilaterally without wheezing or rales. ABDOMEN: Soft  and non-tender with normal pitched bowel sounds.  I do not palpate an aneurysm although it somewhat difficult to assess because of his body habitus. MUSCULOSKELETAL: There are no major deformities or cyanosis. NEUROLOGIC: No focal weakness or paresthesias are detected. SKIN: There are no ulcers or rashes noted. PSYCHIATRIC: The patient has a normal affect.  DATA:    LIMITED CAROTID DUPLEX: I have independently interpreted his limited carotid duplex of the right carotid artery today.  Peak systolic velocity was 211 cm/s with an end-diastolic velocity of 941 cm/s.  Based on our velocities this would put this in a 60 to 79% range at the higher end of that range.  Of note her carotid duplex scan showed multiphasic flow in the right subclavian artery.  CAROTID DUPLEX: I did review the carotid duplex scan that was done at the cardiologist office on 10/01/2020.  This suggested a greater than 80% right carotid stenosis.  They obtained a peak systolic velocity of 740 cm/s with an end-diastolic velocity of 814 cm/s.  On the left side there was a 60 to 79% stenosis.  Both vertebral arteries were patent with antegrade flow.  UPPER EXTREMITY VEIN MAP: I have independently interpreted his upper extremity vein map today.  On the right side the forearm cephalic  vein is marginal in size.  The upper arm cephalic vein looks reasonable in size.  The basilic vein looks marginal in size.  On the left side the forearm cephalic vein looks marginal in size.  The upper arm cephalic vein looks marginal in size.  The basilic vein looks marginal in size.  UPPER EXTREMITY ARTERIAL DUPLEX: I have independently interpreted his upper extremity arterial duplex.  On the right side the brachial artery measures 0.52 cm in diameter.  The bifurcation is at the antecubital fossa.  There is a triphasic radial waveform and a triphasic ulnar waveform at the wrist.  On the left side the brachial artery measures 0.54 cm in diameter.  The  bifurcation is at the antecubital fossa.  There is a triphasic ulnar waveform.  The left radial artery has been harvested for CABG

## 2020-10-16 NOTE — H&P (View-Only) (Signed)
REASON FOR CONSULT:    The patient is referred by Dr. Posey Pronto for evaluation for hemodialysis access.  The patient is also referred by Dr. Johnsie Cancel with a greater than 80% right carotid stenosis.  ASSESSMENT & PLAN:   CAROTID DISEASE: This patient has asymptomatic bilateral 60 to 79% stenoses.  I explained we would not consider carotid endarterectomy less the stenosis progressed to greater than 80%.  I recommended a follow-up carotid duplex scan in 6 months and I will see him back at that time.  Given his cardiac history he could potentially be considered for transcarotid stenting however I do not want to obtain a CT angiogram of the neck given his chronic kidney disease.  If he ultimately ends up on dialysis then this would be an option.  He is on aspirin and is on a statin.  We have discussed the importance of tobacco cessation.  We have also reviewed the symptoms of cerebrovascular disease.  STAGE IV CHRONIC KIDNEY DISEASE: Based on the vein map the best option for a fistula would be a right brachiocephalic fistula.  I would also favor access in the right arm given that the patient has had his left radial artery harvested for coronary revascularization.  We were asked not to place an AV graft if a fistula were not possible.  I discussed the indications for placement of a fistula potential complications and he is agreeable to proceed.  He would like to do this on a Friday so he does not need to miss work and can have a few days before going back to work.  This has been scheduled for 11/08/2020.   Deitra Mayo, MD Office: [phone]   HPI:   Michael Levy is a pleasant 54 y.o. male, who was referred for evaluation for hemodialysis access.  He was also referred for evaluation of carotid disease.  I reviewed the note from Dr. Johnsie Cancel.  He has been followed by him with bilateral carotid disease and on his most recent duplex scan in November the stenosis on the l right had progressed to greater  than 80% and he was sent for vascular consultation.  On his last note his hypertension was under good control.  His hyperlipidemia was under good control.  He does have a history of coronary artery disease and has had previous stents.  I also reviewed the records from [facility].  The patient was noted to have stage V chronic kidney disease.  We were asked to place an AV fistula and not place an AV graft if the fistula was not possible.  The patient has stage IV chronic kidney disease.  He was last seen on 09/20/2020.  He also has hypertension, anemia of renal disease, and secondary hyperparathyroidism.  On my history, the patient denies any history of stroke, TIAs, expressive or receptive aphasia, or amaurosis fugax.  He is right-handed.  His risk factors for peripheral vascular disease include hypertension, hypercholesterolemia, and tobacco use.  He smokes a half a pack per day and has been smoking for 36 years.  He denies any history of diabetes or family history of premature cardiovascular disease.  He is on aspirin and is on a statin.  Past Medical History:  Diagnosis Date  . Anemia    Unknown cause  . Anginal pain ([place])   . Arthritis   . Atherosclerosis 06/2013   per CT age-advanced atherosclerosis of the aorta, great vessels and coronaries.  . Atrial fibrillation (HCC)    Postoperative  .  Bilateral renal cysts 07/2013   per renal ultrasound   . Carotid artery occlusion   . Chronic kidney disease (CKD), stage II (mild)    scored as stage 3 in 07/2013 with acute on chronic renal failure post CABG  . Emphysema 2014  . Gout   . Headache(784.0)    occasional migraines  . Heart attack ([place]) 2005   One stent placed; 2 years later had 4 more stents placed  . Hyperlipidemia   . Hypertension    Essential  . Periodontal disease   . Rapid heart rate     Family History  Problem Relation Age of Onset  . Diabetes Mother   . Hyperlipidemia Mother   . Hypertension Mother    . Coronary artery disease Mother 77       CABG at age 38  . Cancer Father   . Hyperlipidemia Father   . Hypertension Father   . Stroke Father   . Diabetes Maternal Grandmother     SOCIAL HISTORY: Social History   Socioeconomic History  . Marital status: Single    Spouse name: Not on file  . Number of children: 0  . Years of education: Not on file  . Highest education level: Not on file  Occupational History  . Occupation: Leisure centre manager: [employer]  Tobacco Use  . Smoking status: Current Every Day Smoker    Packs/day: 0.50    Years: 36.00    Pack years: 18.00    Types: Cigarettes    Start date: 11/02/1977  . Smokeless tobacco: Former Network engineer  . Vaping Use: Never used  Substance and Sexual Activity  . Alcohol use: Yes    Comment: 2 beers once a month  . Drug use: No    Comment: formerly  . Sexual activity: Not on file  Other Topics Concern  . Not on file  Social History Narrative  . Not on file   Social Determinants of Health   Financial Resource Strain: Not on file  Food Insecurity: Not on file  Transportation Needs: Not on file  Physical Activity: Not on file  Stress: Not on file  Social Connections: Not on file  Intimate Partner Violence: Not on file    Allergies  Allergen Reactions  . Allopurinol     Kidney Failure    Current Outpatient Medications  Medication Sig Dispense Refill  . amLODipine (NORVASC) 2.5 MG tablet Take 1 tablet (2.5 mg total) by mouth daily. 30 tablet 1  . aspirin EC 81 MG tablet Take 1 tablet (81 mg total) by mouth daily. 90 tablet 3  . atorvastatin (LIPITOR) 40 MG tablet Take 1 tablet (40 mg total) by mouth daily. 30 tablet 6  . colchicine 0.6 MG tablet Take 1 tablet (0.6 mg total) by mouth daily. 5 tablet 0  . furosemide (LASIX) 80 MG tablet Take 0.5 tablets (40 mg total) by mouth daily. 30 tablet 6  . isosorbide mononitrate (IMDUR) 30 MG 24 hr tablet Take 1 tablet (30 mg total) by mouth  daily. 30 tablet 0  . metoprolol tartrate (LOPRESSOR) 25 MG tablet Take 1 tablet (25 mg total) by mouth 2 (two) times daily. 60 tablet 1  . zolpidem (AMBIEN) 5 MG tablet      No current facility-administered medications for this visit.    REVIEW OF SYSTEMS:  [X]  denotes positive finding, [ ]  denotes negative finding Cardiac  Comments:  Chest pain or chest pressure:  Shortness of breath upon exertion: x   Short of breath when lying flat:    Irregular heart rhythm:        Vascular    Pain in calf, thigh, or hip brought on by ambulation: x   Pain in feet at night that wakes you up from your sleep:     Blood clot in your veins:    Leg swelling:         Pulmonary    Oxygen at home:    Productive cough:     Wheezing:         Neurologic    Sudden weakness in arms or legs:     Sudden numbness in arms or legs:     Sudden onset of difficulty speaking or slurred speech:    Temporary loss of vision in one eye:     Problems with dizziness:         Gastrointestinal    Blood in stool:     Vomited blood:         Genitourinary    Burning when urinating:     Blood in urine:        Psychiatric    Major depression:         Hematologic    Bleeding problems:    Problems with blood clotting too easily:        Skin    Rashes or ulcers:        Constitutional    Fever or chills:     PHYSICAL EXAM:   Vitals:   10/16/20 1005 10/16/20 1008  BP: (!) 154/78 (!) 158/79  Pulse: (!) 58   Resp: 20   Temp: 98.6 F (37 C)   SpO2: 96%   Weight: 204 lb (92.5 kg)   Height: 5\' 9"  (1.753 m)    Body mass index is 30.13 kg/m.  GENERAL: The patient is a well-nourished male, in no acute distress. The vital signs are documented above. CARDIAC: There is a regular rate and rhythm.  VASCULAR: He has a right carotid bruit. He has palpable femoral pulses and palpable dorsalis pedis pulses bilaterally. PULMONARY: There is good air exchange bilaterally without wheezing or rales. ABDOMEN: Soft  and non-tender with normal pitched bowel sounds.  I do not palpate an aneurysm although it somewhat difficult to assess because of his body habitus. MUSCULOSKELETAL: There are no major deformities or cyanosis. NEUROLOGIC: No focal weakness or paresthesias are detected. SKIN: There are no ulcers or rashes noted. PSYCHIATRIC: The patient has a normal affect.  DATA:    LIMITED CAROTID DUPLEX: I have independently interpreted his limited carotid duplex of the right carotid artery today.  Peak systolic velocity was 732 cm/s with an end-diastolic velocity of 202 cm/s.  Based on our velocities this would put this in a 60 to 79% range at the higher end of that range.  Of note her carotid duplex scan showed multiphasic flow in the right subclavian artery.  CAROTID DUPLEX: I did review the carotid duplex scan that was done at the cardiologist office on 10/01/2020.  This suggested a greater than 80% right carotid stenosis.  They obtained a peak systolic velocity of 542 cm/s with an end-diastolic velocity of 706 cm/s.  On the left side there was a 60 to 79% stenosis.  Both vertebral arteries were patent with antegrade flow.  UPPER EXTREMITY VEIN MAP: I have independently interpreted his upper extremity vein map today.  On the right side the forearm cephalic  vein is marginal in size.  The upper arm cephalic vein looks reasonable in size.  The basilic vein looks marginal in size.  On the left side the forearm cephalic vein looks marginal in size.  The upper arm cephalic vein looks marginal in size.  The basilic vein looks marginal in size.  UPPER EXTREMITY ARTERIAL DUPLEX: I have independently interpreted his upper extremity arterial duplex.  On the right side the brachial artery measures 0.52 cm in diameter.  The bifurcation is at the antecubital fossa.  There is a triphasic radial waveform and a triphasic ulnar waveform at the wrist.  On the left side the brachial artery measures 0.54 cm in diameter.  The  bifurcation is at the antecubital fossa.  There is a triphasic ulnar waveform.  The left radial artery has been harvested for CABG

## 2020-10-18 ENCOUNTER — Other Ambulatory Visit: Payer: Self-pay

## 2020-10-18 DIAGNOSIS — I6523 Occlusion and stenosis of bilateral carotid arteries: Secondary | ICD-10-CM

## 2020-11-07 ENCOUNTER — Other Ambulatory Visit (HOSPITAL_COMMUNITY)
Admission: RE | Admit: 2020-11-07 | Discharge: 2020-11-07 | Disposition: A | Discharge status: Home / Self Care | Payer: BC Managed Care – PPO | Source: Ambulatory Visit | Attending: Vascular Surgery | Admitting: Vascular Surgery

## 2020-11-07 ENCOUNTER — Other Ambulatory Visit: Payer: Self-pay

## 2020-11-07 ENCOUNTER — Encounter (HOSPITAL_COMMUNITY): Payer: Self-pay | Admitting: Vascular Surgery

## 2020-11-07 DIAGNOSIS — D631 Anemia in chronic kidney disease: Secondary | ICD-10-CM | POA: Diagnosis not present

## 2020-11-07 DIAGNOSIS — E785 Hyperlipidemia, unspecified: Secondary | ICD-10-CM | POA: Diagnosis not present

## 2020-11-07 DIAGNOSIS — I120 Hypertensive chronic kidney disease with stage 5 chronic kidney disease or end stage renal disease: Secondary | ICD-10-CM | POA: Diagnosis present

## 2020-11-07 DIAGNOSIS — Z01818 Encounter for other preprocedural examination: Secondary | ICD-10-CM | POA: Insufficient documentation

## 2020-11-07 DIAGNOSIS — I6521 Occlusion and stenosis of right carotid artery: Secondary | ICD-10-CM | POA: Diagnosis not present

## 2020-11-07 DIAGNOSIS — I2510 Atherosclerotic heart disease of native coronary artery without angina pectoris: Secondary | ICD-10-CM | POA: Diagnosis not present

## 2020-11-07 DIAGNOSIS — Z20822 Contact with and (suspected) exposure to covid-19: Secondary | ICD-10-CM | POA: Insufficient documentation

## 2020-11-07 DIAGNOSIS — F17210 Nicotine dependence, cigarettes, uncomplicated: Secondary | ICD-10-CM | POA: Diagnosis not present

## 2020-11-07 DIAGNOSIS — N185 Chronic kidney disease, stage 5: Secondary | ICD-10-CM | POA: Diagnosis not present

## 2020-11-07 DIAGNOSIS — Z79899 Other long term (current) drug therapy: Secondary | ICD-10-CM | POA: Diagnosis not present

## 2020-11-07 DIAGNOSIS — Z7982 Long term (current) use of aspirin: Secondary | ICD-10-CM | POA: Diagnosis not present

## 2020-11-07 LAB — SARS CORONAVIRUS 2 (TAT 6-24 HRS): SARS Coronavirus 2: NEGATIVE

## 2020-11-07 NOTE — Progress Notes (Addendum)
Anesthesia Chart Review:  Follows with Dr. Elberta Spaniel for history of CAD with previous stents to RCA 2010 subsequent CABG 2014 with LIMA to LAD, radial to OM and SVG to PDA/PLB, bilateral carotid stenosis, HTN, HLD. Last seen 09/14/19, no angina at that time. Followup carotid ultrasound 10/01/2020 showed progression of carotid stenosis and Dr. Johnsie Cancel referred the patient to vascular surgery.  Patient was evaluated by Dr. Scot Dock for both carotid disease and need for dialysis access.  Per note 10/16/2020, patient has asymptomatic bilateral 60 to 79% carotid stenosis, endarterectomy not recommended unless the stenosis progresses to greater than 80%.  Stage IV CKD followed by Dr. Posey Pronto at [facility].  He will need to have surgery labs and evaluation.  EKG 09/14/19: NSR. Rate 61. RBBB. Inferior infarct, age undetermined.   Right carotid duplex 10/16/2020: Summary:  Right Carotid: Velocities in the right ICA are consistent with a 60-79% stenosis. Calcific plaque may obscure higher velocities. Velocity obtained at [facility] on 10/01/20 was 373/129 cm/s.   Carotid duplex 10/01/2020: Summary:  Right Carotid: Velocities in the right ICA are consistent with a 80-99%  stenosis. Non-hemodynamically significant plaque <50% noted in  the CCA. The ECA appears >50% stenosed.  Left Carotid: Velocities in the left ICA are consistent with a 60-79% stenosis. Non-hemodynamically significant plaque <50% noted in the CCA. TheECA appears >50% stenosed.  Vertebrals: Bilateral vertebral arteries demonstrate antegrade flow.  Subclavians: Right subclavian artery was stenotic. Normal flow hemodynamics were seen in the left subclavian artery.   TTE 10/09/2019: 1. Left ventricular ejection fraction, by visual estimation, is 50%.  There is mildly increased left ventricular hypertrophy. Basal inferoseptal  and basal to mid inferior severe hypokinesis.  2. Left ventricular diastolic parameters are consistent with  Grade II  diastolic dysfunction (pseudonormalization).  3. Global right ventricle has normal systolic function.The right  ventricular size is normal. No increase in right ventricular wall  thickness.  4. Left atrial size was mild-moderately dilated.  5. Right atrial size was normal.  6. Mild mitral annular calcification.  7. The mitral valve is normal in structure. Trace mitral valve  regurgitation. No evidence of mitral stenosis.  8. The tricuspid valve is normal in structure. Tricuspid valve  regurgitation is trivial.  9. The aortic valve is tricuspid. Aortic valve regurgitation is not  visualized. No evidence of aortic valve sclerosis or stenosis.  10. The inferior vena cava is normal in size with greater than 50%  respiratory variability, suggesting right atrial pressure of 3 mmHg.  11. The tricuspid regurgitant velocity is 2.57 m/s, and with an assumed  right atrial pressure of 3 mmHg, the estimated right ventricular systolic  pressure is normal at 29.4 mmHg.   Carotid duplex 10/02/2019: Summary:  Right Carotid: Velocities in the right ICA are consistent with a 60-79% stenosis. Non-hemodynamically significant plaque <50% noted in the CCA. The ECA appears >50% stenosed.  Left Carotid: Velocities in the left ICA are consistent with a 60-79% stenosis. Non-hemodynamically significant plaque <50% noted in the CCA. The ECA appears >50% stenosed.  Vertebrals: Bilateral vertebral arteries demonstrate antegrade flow.  Subclavians: Right subclavian artery was stenotic. Normal flow hemodynamics were seen in the left subclavian artery.    Wynonia Musty [facility]/Anesthesiology Phone [phone] 11/07/2020 10:46 AM

## 2020-11-07 NOTE — Progress Notes (Signed)
Mr. Wieck denies chest pain or shortness of breath.  Patient was tested for Covid today,  Patient then went to work.  Mr. Montminy states that he is not working with any one.  Mr. Collington reports that he has not been taking ASA,  "no one told me to."  I informed patient that Dr. Scot Dock thinks that he is taking ASA. Mr. Kinney said he will pick up ASA on the way home. I sent Karoline Caldwell, PA- C and Dr. Scot Dock  A message with the information that patient has not been taking ASA.

## 2020-11-07 NOTE — Anesthesia Preprocedure Evaluation (Addendum)
Anesthesia Evaluation  Patient identified by MRN, date of birth, ID band Patient awake    Reviewed: Allergy & Precautions, NPO status , Patient's Chart, lab work & pertinent test results, reviewed documented beta blocker date and time   Airway Mallampati: III  TM Distance: >3 FB Neck ROM: Full    Dental  (+) Edentulous Upper, Edentulous Lower   Pulmonary COPD, Current Smoker,    Pulmonary exam normal breath sounds clear to auscultation       Cardiovascular hypertension, Pt. on home beta blockers and Pt. on medications + angina + Past MI, + Cardiac Stents and + Peripheral Vascular Disease  Normal cardiovascular exam Rhythm:Regular Rate:Normal  Echo 10/2019 1. Left ventricular ejection fraction, by visual estimation, is 50%. There is mildly increased left ventricular hypertrophy. Basal inferoseptal and basal to mid inferior severe hypokinesis.  2. Left ventricular diastolic parameters are consistent with Grade II diastolic dysfunction (pseudonormalization).  3. Global right ventricle has normal systolic function.The right ventricular size is normal. No increase in right ventricular wall thickness.  4. Left atrial size was mild-moderately dilated.  5. Right atrial size was normal.  6. Mild mitral annular calcification.  7. The mitral valve is normal in structure. Trace mitral valve regurgitation. No evidence of mitral stenosis.  8. The tricuspid valve is normal in structure. Tricuspid valve regurgitation is trivial.  9. The aortic valve is tricuspid. Aortic valve regurgitation is not visualized. No evidence of aortic valve sclerosis or stenosis.  10. The inferior vena cava is normal in size with greater than 50% respiratory variability, suggesting right atrial pressure of 3 mmHg.  11. The tricuspid regurgitant velocity is 2.57 m/s, and with an assumed right atrial pressure of 3 mmHg, the estimated right ventricular systolic  pressure is normal at 29.4 mmHg.    Neuro/Psych  Headaches,    GI/Hepatic negative GI ROS, Neg liver ROS,   Endo/Other  negative endocrine ROS  Renal/GU Renal disease     Musculoskeletal  (+) Arthritis ,   Abdominal   Peds  Hematology  (+) Blood dyscrasia, anemia ,   Anesthesia Other Findings   Reproductive/Obstetrics                                                            Anesthesia Evaluation  Patient identified by MRN, date of birth, ID band Patient awake    Reviewed: Allergy & Precautions, H&P , NPO status , Patient's Chart, lab work & pertinent test results  History of Anesthesia Complications Negative for: history of anesthetic complications  Airway Mallampati: III TM Distance: >3 FB Neck ROM: Full  Mouth opening: Limited Mouth Opening  Dental  (+) Edentulous Upper, Missing, Poor Dentition and Dental Advisory Given,    Pulmonary COPDCurrent Smoker,  breath sounds clear to auscultation        Cardiovascular hypertension, Pt. on home beta blockers + angina + Past MI Rhythm:Regular Rate:Normal     Neuro/Psych  Headaches,    GI/Hepatic   Endo/Other    Renal/GU Renal InsufficiencyRenal disease     Musculoskeletal   Abdominal   Peds  Hematology   Anesthesia Other Findings   Reproductive/Obstetrics                         Anesthesia Physical  Anesthesia Plan  ASA: III  Anesthesia Plan: General   Post-op Pain Management:    Induction: Intravenous  Airway Management Planned: Oral ETT  Additional Equipment: Arterial line and PA Cath  Intra-op Plan:   Post-operative Plan: Post-operative intubation/ventilation  Informed Consent: I have reviewed the patients History and Physical, chart, labs and discussed the procedure including the risks, benefits and alternatives for the proposed anesthesia with the patient or authorized representative who has indicated his/her understanding and  acceptance.     Plan Discussed with: CRNA and Anesthesiologist  Anesthesia Plan Comments: (3V CAD with progressive angina and previous MI age 55 and previous stenting  Rena linsufficiency Cr 1.80 Htn Smoker  Plan GA with oral ETT  Roberts Gaudy, MD)      Anesthesia Quick Evaluation  Anesthesia Physical Anesthesia Plan  ASA: III  Anesthesia Plan: MAC   Post-op Pain Management:    Induction: Intravenous  PONV Risk Score and Plan: 0 and Propofol infusion, TIVA and Treatment may vary due to age or medical condition  Airway Management Planned: Natural Airway  Additional Equipment: None  Intra-op Plan:   Post-operative Plan:   Informed Consent: I have reviewed the patients History and Physical, chart, labs and discussed the procedure including the risks, benefits and alternatives for the proposed anesthesia with the patient or authorized representative who has indicated his/her understanding and acceptance.     Dental advisory given  Plan Discussed with: CRNA  Anesthesia Plan Comments: (PAT note by Karoline Caldwell, PA-C: Follows with Dr. Elberta Spaniel for history of CAD with previous stents to RCA 2010 subsequent CABG 2014 with LIMA to LAD, radial to OM and SVG to PDA/PLB, bilateral carotid stenosis, HTN, HLD. Last seen 09/14/19, no angina at that time. Followup carotid ultrasound 10/01/2020 showed progression of carotid stenosis and Dr. Johnsie Cancel referred the patient to vascular surgery.  Patient was evaluated by Dr. Scot Dock for both carotid disease and need for dialysis access.  Per note 10/16/2020, patient has asymptomatic bilateral 60 to 79% carotid stenosis, endarterectomy not recommended unless the stenosis progresses to greater than 80%.  Stage IV CKD followed by Dr. Posey Pronto at [facility].  He will need to have surgery labs and evaluation.  EKG 09/14/19: NSR. Rate 61. RBBB. Inferior infarct, age undetermined.   Right carotid duplex 10/16/2020: Summary:  Right  Carotid: Velocities in the right ICA are consistent with a 60-79% stenosis. Calcific plaque may obscure higher velocities. Velocity obtained at [facility] on 10/01/20 was 373/129 cm/s.   Carotid duplex 10/01/2020: Summary:  Right Carotid: Velocities in the right ICA are consistent with a 80-99%  stenosis. Non-hemodynamically significant plaque <50% noted in  the CCA. The ECA appears >50% stenosed.  Left Carotid: Velocities in the left ICA are consistent with a 60-79% stenosis. Non-hemodynamically significant plaque <50% noted in the CCA. TheECA appears >50% stenosed.  Vertebrals: Bilateral vertebral arteries demonstrate antegrade flow.  Subclavians: Right subclavian artery was stenotic. Normal flow hemodynamics were seen in the left subclavian artery.   TTE 10/09/2019: 1. Left ventricular ejection fraction, by visual estimation, is 50%.  There is mildly increased left ventricular hypertrophy. Basal inferoseptal  and basal to mid inferior severe hypokinesis.  2. Left ventricular diastolic parameters are consistent with Grade II  diastolic dysfunction (pseudonormalization).  3. Global right ventricle has normal systolic function.The right  ventricular size is normal. No increase in right ventricular wall  thickness.  4. Left atrial size was mild-moderately dilated.  5. Right atrial size was normal.  6. Mild  mitral annular calcification.  7. The mitral valve is normal in structure. Trace mitral valve  regurgitation. No evidence of mitral stenosis.  8. The tricuspid valve is normal in structure. Tricuspid valve  regurgitation is trivial.  9. The aortic valve is tricuspid. Aortic valve regurgitation is not  visualized. No evidence of aortic valve sclerosis or stenosis.  10. The inferior vena cava is normal in size with greater than 50%  respiratory variability, suggesting right atrial pressure of 3 mmHg.  11. The tricuspid regurgitant velocity is 2.57 m/s, and with an assumed  right  atrial pressure of 3 mmHg, the estimated right ventricular systolic  pressure is normal at 29.4 mmHg.   Carotid duplex 10/02/2019: Summary:  Right Carotid: Velocities in the right ICA are consistent with a 60-79% stenosis. Non-hemodynamically significant plaque <50% noted in the CCA. The ECA appears >50% stenosed.  Left Carotid: Velocities in the left ICA are consistent with a 60-79% stenosis. Non-hemodynamically significant plaque <50% noted in the CCA. The ECA appears >50% stenosed.  Vertebrals: Bilateral vertebral arteries demonstrate antegrade flow.  Subclavians: Right subclavian artery was stenotic. Normal flow hemodynamics were seen in the left subclavian artery.   )      Anesthesia Quick Evaluation

## 2020-11-08 ENCOUNTER — Ambulatory Visit (HOSPITAL_COMMUNITY)
Admission: RE | Admit: 2020-11-08 | Discharge: 2020-11-08 | Disposition: A | Discharge status: Home / Self Care | Payer: BC Managed Care – PPO | Attending: Vascular Surgery | Admitting: Vascular Surgery

## 2020-11-08 ENCOUNTER — Ambulatory Visit (HOSPITAL_COMMUNITY): Payer: BC Managed Care – PPO | Admitting: Physician Assistant

## 2020-11-08 ENCOUNTER — Encounter (HOSPITAL_COMMUNITY)
Admission: RE | Disposition: A | Discharge status: Home / Self Care | Payer: Self-pay | Source: Home / Self Care | Attending: Vascular Surgery

## 2020-11-08 ENCOUNTER — Encounter (HOSPITAL_COMMUNITY): Payer: Self-pay | Admitting: Vascular Surgery

## 2020-11-08 ENCOUNTER — Other Ambulatory Visit: Payer: Self-pay

## 2020-11-08 DIAGNOSIS — Z7982 Long term (current) use of aspirin: Secondary | ICD-10-CM | POA: Insufficient documentation

## 2020-11-08 DIAGNOSIS — F17210 Nicotine dependence, cigarettes, uncomplicated: Secondary | ICD-10-CM | POA: Insufficient documentation

## 2020-11-08 DIAGNOSIS — E785 Hyperlipidemia, unspecified: Secondary | ICD-10-CM | POA: Insufficient documentation

## 2020-11-08 DIAGNOSIS — I120 Hypertensive chronic kidney disease with stage 5 chronic kidney disease or end stage renal disease: Secondary | ICD-10-CM | POA: Diagnosis not present

## 2020-11-08 DIAGNOSIS — N185 Chronic kidney disease, stage 5: Secondary | ICD-10-CM

## 2020-11-08 DIAGNOSIS — I6521 Occlusion and stenosis of right carotid artery: Secondary | ICD-10-CM | POA: Insufficient documentation

## 2020-11-08 DIAGNOSIS — N183 Chronic kidney disease, stage 3 unspecified: Secondary | ICD-10-CM

## 2020-11-08 DIAGNOSIS — I2510 Atherosclerotic heart disease of native coronary artery without angina pectoris: Secondary | ICD-10-CM | POA: Insufficient documentation

## 2020-11-08 DIAGNOSIS — Z20822 Contact with and (suspected) exposure to covid-19: Secondary | ICD-10-CM | POA: Insufficient documentation

## 2020-11-08 DIAGNOSIS — Z79899 Other long term (current) drug therapy: Secondary | ICD-10-CM | POA: Insufficient documentation

## 2020-11-08 DIAGNOSIS — D631 Anemia in chronic kidney disease: Secondary | ICD-10-CM | POA: Insufficient documentation

## 2020-11-08 HISTORY — DX: Peripheral vascular disease, unspecified: I73.9

## 2020-11-08 HISTORY — PX: AV FISTULA PLACEMENT: SHX1204

## 2020-11-08 LAB — POCT I-STAT, CHEM 8
BUN: 46 mg/dL — ABNORMAL HIGH (ref 6–20)
Calcium, Ion: 1.2 mmol/L (ref 1.15–1.40)
Chloride: 118 mmol/L — ABNORMAL HIGH (ref 98–111)
Creatinine, Ser: 5.8 mg/dL — ABNORMAL HIGH (ref 0.61–1.24)
Glucose, Bld: 80 mg/dL (ref 70–99)
HCT: 29 % — ABNORMAL LOW (ref 39.0–52.0)
Hemoglobin: 9.9 g/dL — ABNORMAL LOW (ref 13.0–17.0)
Potassium: 4.4 mmol/L (ref 3.5–5.1)
Sodium: 143 mmol/L (ref 135–145)
TCO2: 17 mmol/L — ABNORMAL LOW (ref 22–32)

## 2020-11-08 SURGERY — ARTERIOVENOUS (AV) FISTULA CREATION
Anesthesia: Monitor Anesthesia Care | Site: Arm Upper | Laterality: Right

## 2020-11-08 MED ORDER — PROPOFOL 10 MG/ML IV BOLUS
INTRAVENOUS | Status: DC | PRN
  Administered 2020-11-08: 10 mg via INTRAVENOUS
  Administered 2020-11-08: 20 mg via INTRAVENOUS

## 2020-11-08 MED ORDER — PHENYLEPHRINE 40 MCG/ML (10ML) SYRINGE FOR IV PUSH (FOR BLOOD PRESSURE SUPPORT)
PREFILLED_SYRINGE | INTRAVENOUS | Status: AC
  Filled 2020-11-08: qty 10

## 2020-11-08 MED ORDER — LACTATED RINGERS IV SOLN: INTRAVENOUS | Status: DC

## 2020-11-08 MED ORDER — PROMETHAZINE HCL 25 MG/ML IJ SOLN: 6.2500 mg | INTRAMUSCULAR | Status: DC | PRN

## 2020-11-08 MED ORDER — SODIUM CHLORIDE 0.9 % IV SOLN
INTRAVENOUS | Status: AC
  Filled 2020-11-08: qty 1.2

## 2020-11-08 MED ORDER — FENTANYL CITRATE (PF) 100 MCG/2ML IJ SOLN
INTRAMUSCULAR | Status: DC | PRN
  Administered 2020-11-08: 25 ug via INTRAVENOUS

## 2020-11-08 MED ORDER — CHLORHEXIDINE GLUCONATE 4 % EX LIQD: 60.0000 mL | Freq: Once | CUTANEOUS | Status: DC

## 2020-11-08 MED ORDER — DROPERIDOL 2.5 MG/ML IJ SOLN: 0.6250 mg | Freq: Once | INTRAMUSCULAR | Status: DC | PRN

## 2020-11-08 MED ORDER — OXYCODONE-ACETAMINOPHEN 5-325 MG PO TABS: 1.0000 | ORAL_TABLET | ORAL | 0 refills | Status: AC | PRN

## 2020-11-08 MED ORDER — CHLORHEXIDINE GLUCONATE 0.12 % MT SOLN
15.0000 mL | Freq: Once | OROMUCOSAL | Status: AC
  Administered 2020-11-08: 15 mL via OROMUCOSAL
  Filled 2020-11-08: qty 15

## 2020-11-08 MED ORDER — PROPOFOL 500 MG/50ML IV EMUL
INTRAVENOUS | Status: DC | PRN
  Administered 2020-11-08: 100 ug/kg/min via INTRAVENOUS

## 2020-11-08 MED ORDER — LIDOCAINE-EPINEPHRINE (PF) 1 %-1:200000 IJ SOLN
INTRAMUSCULAR | Status: AC
  Filled 2020-11-08: qty 30

## 2020-11-08 MED ORDER — PROPOFOL 10 MG/ML IV BOLUS
INTRAVENOUS | Status: AC
  Filled 2020-11-08: qty 20

## 2020-11-08 MED ORDER — LIDOCAINE-EPINEPHRINE (PF) 1 %-1:200000 IJ SOLN
INTRAMUSCULAR | Status: DC | PRN
  Administered 2020-11-08: 5 mL

## 2020-11-08 MED ORDER — FENTANYL CITRATE (PF) 100 MCG/2ML IJ SOLN: 25.0000 ug | INTRAMUSCULAR | Status: DC | PRN

## 2020-11-08 MED ORDER — LIDOCAINE HCL 2 % IJ SOLN
INTRAMUSCULAR | Status: AC
  Filled 2020-11-08: qty 20

## 2020-11-08 MED ORDER — LIDOCAINE 2% (20 MG/ML) 5 ML SYRINGE
INTRAMUSCULAR | Status: DC | PRN
  Administered 2020-11-08: 40 mg via INTRAVENOUS

## 2020-11-08 MED ORDER — SODIUM CHLORIDE 0.9 % IV SOLN
INTRAVENOUS | Status: DC | PRN
  Administered 2020-11-08: 500 mL

## 2020-11-08 MED ORDER — CEFAZOLIN SODIUM-DEXTROSE 2-4 GM/100ML-% IV SOLN
2.0000 g | INTRAVENOUS | Status: AC
  Administered 2020-11-08: 2 g via INTRAVENOUS
  Filled 2020-11-08: qty 100

## 2020-11-08 MED ORDER — HEMOSTATIC AGENTS (NO CHARGE) OPTIME
TOPICAL | Status: DC | PRN
  Administered 2020-11-08: 1 via TOPICAL

## 2020-11-08 MED ORDER — ONDANSETRON HCL 4 MG/2ML IJ SOLN
INTRAMUSCULAR | Status: DC | PRN
  Administered 2020-11-08: 4 mg via INTRAVENOUS

## 2020-11-08 MED ORDER — ONDANSETRON HCL 4 MG/2ML IJ SOLN
INTRAMUSCULAR | Status: AC
  Filled 2020-11-08: qty 2

## 2020-11-08 MED ORDER — FENTANYL CITRATE (PF) 250 MCG/5ML IJ SOLN
INTRAMUSCULAR | Status: AC
  Filled 2020-11-08: qty 5

## 2020-11-08 MED ORDER — ORAL CARE MOUTH RINSE: 15.0000 mL | Freq: Once | OROMUCOSAL | Status: AC

## 2020-11-08 MED ORDER — SODIUM CHLORIDE 0.9 % IV SOLN: INTRAVENOUS | Status: DC

## 2020-11-08 MED ORDER — PHENYLEPHRINE HCL-NACL 10-0.9 MG/250ML-% IV SOLN
INTRAVENOUS | Status: DC | PRN
  Administered 2020-11-08: 30 ug/min via INTRAVENOUS

## 2020-11-08 MED ORDER — 0.9 % SODIUM CHLORIDE (POUR BTL) OPTIME
TOPICAL | Status: DC | PRN
  Administered 2020-11-08: 1000 mL

## 2020-11-08 SURGICAL SUPPLY — 31 items
ADH SKN CLS APL DERMABOND .7 (GAUZE/BANDAGES/DRESSINGS) ×1
ARMBAND PINK RESTRICT EXTREMIT (MISCELLANEOUS) ×4 IMPLANT
CANISTER SUCT 3000ML PPV (MISCELLANEOUS) ×2 IMPLANT
CANNULA VESSEL 3MM 2 BLNT TIP (CANNULA) ×1 IMPLANT
CLIP VESOCCLUDE MED 6/CT (CLIP) ×2 IMPLANT
CLIP VESOCCLUDE SM WIDE 6/CT (CLIP) ×2 IMPLANT
COVER PROBE W GEL 5X96 (DRAPES) ×1 IMPLANT
DECANTER SPIKE VIAL GLASS SM (MISCELLANEOUS) ×2 IMPLANT
DERMABOND ADVANCED (GAUZE/BANDAGES/DRESSINGS) ×1
DERMABOND ADVANCED .7 DNX12 (GAUZE/BANDAGES/DRESSINGS) ×1 IMPLANT
ELECT REM PT RETURN 9FT ADLT (ELECTROSURGICAL) ×2
ELECTRODE REM PT RTRN 9FT ADLT (ELECTROSURGICAL) ×1 IMPLANT
GLOVE BIO SURGEON STRL SZ7.5 (GLOVE) ×2 IMPLANT
GLOVE BIOGEL PI IND STRL 8 (GLOVE) ×1 IMPLANT
GLOVE BIOGEL PI INDICATOR 8 (GLOVE) ×1
GOWN STRL REUS W/ TWL LRG LVL3 (GOWN DISPOSABLE) ×3 IMPLANT
GOWN STRL REUS W/TWL LRG LVL3 (GOWN DISPOSABLE) ×6
HEMOSTAT SURGICEL 2X14 (HEMOSTASIS) ×1 IMPLANT
KIT BASIN OR (CUSTOM PROCEDURE TRAY) ×2 IMPLANT
KIT TURNOVER KIT B (KITS) ×2 IMPLANT
NS IRRIG 1000ML POUR BTL (IV SOLUTION) ×2 IMPLANT
PACK CV ACCESS (CUSTOM PROCEDURE TRAY) ×2 IMPLANT
PAD ARMBOARD 7.5X6 YLW CONV (MISCELLANEOUS) ×4 IMPLANT
SPONGE SURGIFOAM ABS GEL 100 (HEMOSTASIS) IMPLANT
SUT PROLENE 6 0 BV (SUTURE) ×2 IMPLANT
SUT VIC AB 3-0 SH 27 (SUTURE) ×2
SUT VIC AB 3-0 SH 27X BRD (SUTURE) ×1 IMPLANT
SUT VICRYL 4-0 PS2 18IN ABS (SUTURE) ×2 IMPLANT
TOWEL GREEN STERILE (TOWEL DISPOSABLE) ×2 IMPLANT
UNDERPAD 30X36 HEAVY ABSORB (UNDERPADS AND DIAPERS) ×2 IMPLANT
WATER STERILE IRR 1000ML POUR (IV SOLUTION) ×2 IMPLANT

## 2020-11-08 NOTE — Op Note (Signed)
    Patient name: Zakhari Fogel MRN: [id number] DOB: [birth year] Sex: male  11/08/2020 Pre-operative Diagnosis: Chronic renal insufficiency Post-operative diagnosis:  Same Surgeon:  [name] Assistants:  Ivin Booty Procedure:   Right brachiocephalic fistula Anesthesia: MAC Blood Loss: Minimal Specimens: None  Findings: 4 mm disease-free artery.  4 mm cephalic vein  Indications: The patient comes in today for dialysis access  Procedure:  The patient was identified in the holding area and taken to [place] [address]  The patient was then placed supine on the table. MAC anesthesia was administered.  The patient was prepped and draped in the usual sterile fashion.  A time out was called and antibiotics were administered.  A PA was necessary to extract the procedure and assist with technical details including suction and retraction.  Ultrasound was used to evaluate the cephalic vein of the upper arm.  It was adequate for fistula creation beginning in the antecubital crease.  1% lidocaine was used for local anesthesia.  A transverse incision was made just proximal to the antecubital crease.  Cautery was used about the subcutaneous tissue.  The brachial artery was dissected free.  This was a disease-free 4-5 mm artery.  It was encircled proximally and distally with Vesseloops.  Next the cephalic vein was identified and fully mobilized.  Side branches were ligated between silk ties.  The vein measured 4-5 mm.  The vein was then marked for orientation and ligated distally.  It distended nicely with heparin saline.  Next, the brachial artery was occluded with vascular clamps.  A #11 blade was used to make an arteriotomy which was extended longitudinally with Potts scissors.  The vein was then spatulated to fit the size of the arteriotomy.  A running end-to-side anastomosis was created with 6-0 Prolene.  Prior to completion the appropriate flushing maneuvers were performed and the anastomosis was completed.  I  inspected the course of the vein to make sure there were no kinks.  There was an excellent palpable thrill within the fistula.  The patient had a monophasic radial Doppler signal which did not change with fistula compression.  There was a triphasic ulnar artery Doppler signal.  The wound was then copiously irrigated.  Hemostasis was achieved.  The incision was closed with 2 layers of Vicryl followed by Dermabond.  There were no immediate complications.   Disposition: To PACU stable.   Theotis Burrow, M.D., [facility] [place] Office: [phone] Pager:  [phone]

## 2020-11-08 NOTE — Interval H&P Note (Signed)
History and Physical Interval Note:  11/08/2020 7:33 AM  Michael Levy  has presented today for surgery, with the diagnosis of CHRONIC KIDNEY DISEASE STAGE V.  The various methods of treatment have been discussed with the patient and family. After consideration of risks, benefits and other options for treatment, the patient has consented to  Procedure(s): RIGHT BRACHICEPHALIC ARTERIOVENOUS (AV) FISTULA CREATION (Right) as a surgical intervention.  The patient's history has been reviewed, patient examined, no change in status, stable for surgery.  I have reviewed the patient's chart and labs.  Questions were answered to the patient's satisfaction.     [name]

## 2020-11-08 NOTE — Discharge Instructions (Signed)
[facility]  Discharge Instructions  AV Fistula or Graft Surgery for Dialysis Access  Please refer to the following instructions for your post-procedure care. Your surgeon or physician assistant will discuss any changes with you.  Activity  You may drive the day following your surgery, if you are comfortable and no longer taking prescription pain medication. Resume full activity as the soreness in your incision resolves.  Bathing/Showering  You may shower after you go home. Keep your incision dry for 48 hours. Do not soak in a bathtub, hot tub, or swim until the incision heals completely. You may not shower if you have a hemodialysis catheter.  Incision Care  Clean your incision with mild soap and water after 48 hours. Pat the area dry with a clean towel. You do not need a bandage unless otherwise instructed. Do not apply any ointments or creams to your incision. You may have skin glue on your incision. Do not peel it off. It will come off on its own in about one week. Your arm may swell a bit after surgery. To reduce swelling use pillows to elevate your arm so it is above your heart. Your doctor will tell you if you need to lightly wrap your arm with an ACE bandage.  Diet  Resume your normal diet. There are not special food restrictions following this procedure. In order to heal from your surgery, it is CRITICAL to get adequate nutrition. Your body requires vitamins, minerals, and protein. Vegetables are the best source of vitamins and minerals. Vegetables also provide the perfect balance of protein. Processed food has little nutritional value, so try to avoid this.  Medications  Resume taking all of your medications. If your incision is causing pain, you may take over-the counter pain relievers such as acetaminophen (Tylenol). If you were prescribed a stronger pain medication, please be aware these medications can cause nausea and constipation. Prevent  nausea by taking the medication with a snack or meal. Avoid constipation by drinking plenty of fluids and eating foods with high amount of fiber, such as fruits, vegetables, and grains.  Do not take Tylenol if you are taking prescription pain medications.  Follow up Your surgeon may want to see you in the office following your access surgery. If so, this will be arranged at the time of your surgery.  Please call us immediately for any of the following conditions:   Increased pain, redness, drainage (pus) from your incision site  Fever of 101 degrees or higher  Severe or worsening pain at your incision site  Hand pain or numbness.   Reduce your risk of vascular disease:   Stop smoking. If you would like help, call [facility] at 1-800-QUIT-NOW (647) 008-7810) or [place] at [place] your cholesterol  Maintain a desired weight  Control your diabetes  Keep your blood pressure down  Dialysis  It will take several weeks to several months for your new dialysis access to be ready for use. Your surgeon will determine when it is okay to use it. Your nephrologist will continue to direct your dialysis. You can continue to use your Permcath until your new access is ready for use.   11/08/2020 Michael Levy [id number] 1966-09-30  Surgeon(s): Serafina Mitchell, MD  Procedure(s): RIGHT BRACHICEPHALIC ARTERIOVENOUS (AV) FISTULA CREATION   May stick graft immediately   May stick graft on designated area only:   X Do not stick right AV fistula for 12 weeks  If you have any questions, please call the office at [phone].

## 2020-11-08 NOTE — Anesthesia Postprocedure Evaluation (Signed)
Anesthesia Post Note  Patient: Michael Levy  Procedure(s) Performed: RIGHT BRACHICEPHALIC ARTERIOVENOUS (AV) FISTULA CREATION (Right Arm Upper)     Patient location during evaluation: PACU Anesthesia Type: MAC Level of consciousness: awake and alert Pain management: pain level controlled Vital Signs Assessment: post-procedure vital signs reviewed and stable Respiratory status: spontaneous breathing Cardiovascular status: stable Anesthetic complications: no   No complications documented.  Last Vitals:  Vitals:   11/08/20 0935 11/08/20 0943  BP: 140/71 (!) 142/70  Pulse: 63 62  Resp: 17 17  Temp:  (!) 36.1 C  SpO2: 95% 97%    Last Pain:  Vitals:   11/08/20 0943  TempSrc:   PainSc: 0-No pain                 Nolon Nations

## 2020-11-08 NOTE — Transfer of Care (Signed)
Immediate Anesthesia Transfer of Care Note  Patient: Michael Levy  Procedure(s) Performed: RIGHT BRACHICEPHALIC ARTERIOVENOUS (AV) FISTULA CREATION (Right Arm Upper)  Patient Location: PACU  Anesthesia Type:MAC  Level of Consciousness: awake and alert   Airway & Oxygen Therapy: Patient Spontanous Breathing  Post-op Assessment: Report given to RN and Post -op Vital signs reviewed and stable  Post vital signs: Reviewed and stable  Last Vitals:  Vitals Value Taken Time  BP    Temp    Pulse    Resp    SpO2      Last Pain:  Vitals:   11/08/20 0748  TempSrc:   PainSc: 0-No pain      Patients Stated Pain Goal: 3 (73/66/81 [id number])  Complications: No complications documented.

## 2020-11-08 NOTE — Anesthesia Procedure Notes (Signed)
Procedure Name: MAC Date/Time: 11/08/2020 8:07 AM Performed by: Inda Coke, CRNA Pre-anesthesia Checklist: Patient identified, Emergency Drugs available, Suction available, Timeout performed and Patient being monitored Patient Re-evaluated:Patient Re-evaluated prior to induction Oxygen Delivery Method: Simple face mask Induction Type: IV induction Dental Injury: Teeth and Oropharynx as per pre-operative assessment

## 2020-11-09 ENCOUNTER — Encounter (HOSPITAL_COMMUNITY): Payer: Self-pay | Admitting: Surgery

## 2020-11-19 ENCOUNTER — Other Ambulatory Visit: Payer: Self-pay | Admitting: *Deleted

## 2020-11-19 DIAGNOSIS — N189 Chronic kidney disease, unspecified: Secondary | ICD-10-CM

## 2020-11-22 NOTE — Progress Notes (Signed)
CARDIOLOGY CONSULT NOTE      Virtual Visit via Video Note   This visit type was conducted due to national recommendations for restrictions regarding the COVID-19 Pandemic (e.g. social distancing) in an effort to limit this patient's exposure and mitigate transmission in our community.  Due to her co-morbid illnesses, this patient is at least at moderate risk for complications without adequate follow up.  This format is felt to be most appropriate for this patient at this time.  All issues noted in this document were discussed and addressed.  A limited physical exam was performed with this format.  Please refer to the patient's chart for her consent to telehealth for [facility].   Referring Physician: Laural Benes Primary Cardiologist: Johnsie Cancel   Patient Location : Home Physician Location: Office    HPI:  55 y.o. previously seen by Dr Martinique.  History of MI at age 45 at [place]. Stents to RCA/PDA 2010 Active smoker Had recurrent chest pain August 2014 cath by Dr Martinique. Reviewed:  Severe 3 VD mild to moderate LV dysfunction recommended CABG Done by Dr Roxan Hockey 07/14/13 with LIMA to LAD Left Radial to OM1, SVG PDA/PLB Post op course complicated by renal failure, PAF and ileus Had moderate bilateral 40-59% ICA stenosis on pre CABG dopplers   CRF stage 4 followed by Dr Posey Pronto at [facility] is 3.5 Anemia Hct 37.5 LDL low at 25   He works at [employer] in [place] and knows Zachery Dauer Never married Has mom and two sisters in town  Discussed need for lung cancer screening CT  No angina Fairly sedentary   Seen by vascular Scot Dock for dialysis access and bilateral 60-79% ICA stenosis  Right brachiocephalic fistula done under MAC 11/08/20   No show  ROS All other systems reviewed and negative except as noted above  Past Medical History:  Diagnosis Date   Anemia    Unknown cause   Anginal pain ([place])    Arthritis    Atherosclerosis 06/2013   per CT age-advanced  atherosclerosis of the aorta, great vessels and coronaries.   Atrial fibrillation (HCC)    Postoperative   Bilateral renal cysts 07/2013   per renal ultrasound    Carotid artery occlusion    Chronic kidney disease (CKD), stage II (mild)    scored as stage 3 in 07/2013 with acute on chronic renal failure post CABG   Emphysema 2014   Gout    Headache(784.0)    occasional migraines   Heart attack ([place]) 2005   One stent placed; 2 years later had 4 more stents placed   Hyperlipidemia    Hypertension    Essential   Periodontal disease    Peripheral vascular disease ([place])    Carotid   Rapid heart rate     Family History  Problem Relation Age of Onset   Diabetes Mother    Hyperlipidemia Mother    Hypertension Mother    Coronary artery disease Mother 46       CABG at age 45   Cancer Father    Hyperlipidemia Father    Hypertension Father    Stroke Father    Diabetes Maternal Grandmother     Social History   Socioeconomic History   Marital status: Single    Spouse name: Not on file   Number of children: 0   Years of education: Not on file   Highest education level: Not on file  Occupational History   Occupation: General maintance  Employer: [employer]  Tobacco Use   Smoking status: Current Every Day Smoker    Packs/day: 0.25    Years: 43.00    Pack years: 10.75    Types: Cigarettes    Start date: 11/02/1977   Smokeless tobacco: Former Counsellor Use: Never used  Substance and Sexual Activity   Alcohol use: Not Currently    Comment: 2 beers once a month   Drug use: No    Comment: formerly   Sexual activity: Not on file  Other Topics Concern   Not on file  Social History Narrative   Not on file   Social Determinants of Health   Financial Resource Strain: Not on file  Food Insecurity: Not on file  Transportation Needs: Not on file  Physical Activity: Not on file  Stress: Not on file  Social Connections: Not on file  Intimate  Partner Violence: Not on file    Past Surgical History:  Procedure Laterality Date   AV FISTULA PLACEMENT Right 11/08/2020   Procedure: RIGHT BRACHICEPHALIC ARTERIOVENOUS (AV) FISTULA CREATION;  Surgeon: Serafina Mitchell, MD;  Location: [facility];  Service: Vascular;  Laterality: Right;   CARDIAC CATHETERIZATION     06/30/13 Dr Martinique   CORONARY ANGIOPLASTY WITH STENT PLACEMENT     CORONARY ARTERY BYPASS GRAFT N/A 07/14/2013   Procedure: CORONARY ARTERY BYPASS GRAFTING (CABG);  Surgeon: Melrose Nakayama, MD;  Location: [facility];  Service: Open Heart Surgery;  Laterality: N/A;   LEFT HEART CATHETERIZATION WITH CORONARY ANGIOGRAM N/A 06/30/2013   Procedure: LEFT HEART CATHETERIZATION WITH CORONARY ANGIOGRAM;  Surgeon: Lupita Rosales M Martinique, MD;  Location: [facility] CATH LAB;  Service: Cardiovascular;  Laterality: N/A;   RADIAL ARTERY HARVEST Left 07/14/2013   Procedure: RADIAL ARTERY HARVEST;  Surgeon: Melrose Nakayama, MD;  Location: [facility];  Service: Vascular;  Laterality: Left;   TONSILLECTOMY          Physical Exam: There were no vitals taken for this visit.   No distress No JVP elevation  Right brachiocephalic fistula No edema    Labs:   Lab Results  Component Value Date   WBC 15.9 (H) 07/24/2013   HGB 9.9 (L) 11/08/2020   HCT 29.0 (L) 11/08/2020   MCV 88.4 07/24/2013   PLT 361 07/24/2013   No results for input(s): NA, K, CL, CO2, BUN, CREATININE, CALCIUM, PROT, BILITOT, ALKPHOS, ALT, AST, GLUCOSE in the last 168 hours.  Invalid input(s): LABALBU Lab Results  Component Value Date   CKTOTAL 2,934 (H) 07/15/2013     Radiology: No results found.  EKG: 2014 SR RBBB old IMI  09/14/19 SR rate 55 RBBB old IMI  No changes    ASSESSMENT AND PLAN:   1. CAD with previous stents to RCA 2010 subsequent CABG 2014 with LIMA to LAD, radial to OM and SVG to PDA/PLB no angina continue statin asa and beta blocker  2. Reduced EF:  EF improved 50% by TTE 10/09/19  3. PV/Carotid:  F/U [facility] for  60-79% bilateral ICA and fistula surveillance  4. Smoking:  Counseled on cessation for less than 10 minutes has failed Chantix before Lung cancer screening CT 5. HTN:  Well controlled.  Continue current medications and low sodium Dash type diet.   6. HLD  On statin labs with primary target LDL 70 or less    F/U [facility] for carotid US Lung cancer screening CT  F/U cardiology in a year  Signed: [name]  Humza Tallerico 11/25/2020, 8:42 AM

## 2020-11-25 ENCOUNTER — Other Ambulatory Visit: Payer: Self-pay

## 2020-11-25 ENCOUNTER — Telehealth (INDEPENDENT_AMBULATORY_CARE_PROVIDER_SITE_OTHER): Payer: BC Managed Care – PPO | Admitting: Cardiovascular Disease

## 2020-11-25 DIAGNOSIS — R9439 Abnormal result of other cardiovascular function study: Secondary | ICD-10-CM

## 2020-12-11 ENCOUNTER — Ambulatory Visit (HOSPITAL_COMMUNITY)
Admission: RE | Admit: 2020-12-11 | Discharge: 2020-12-11 | Disposition: A | Discharge status: Home / Self Care | Payer: BC Managed Care – PPO | Source: Ambulatory Visit | Attending: Vascular Surgery | Admitting: Vascular Surgery

## 2020-12-11 ENCOUNTER — Other Ambulatory Visit: Payer: Self-pay

## 2020-12-11 ENCOUNTER — Ambulatory Visit (INDEPENDENT_AMBULATORY_CARE_PROVIDER_SITE_OTHER): Payer: BC Managed Care – PPO | Admitting: Physician Assistant

## 2020-12-11 VITALS — BP 130/68 | HR 69 | Temp 98.9°F | Resp 20 | Ht 69.0 in | Wt 195.4 lb

## 2020-12-11 DIAGNOSIS — N189 Chronic kidney disease, unspecified: Secondary | ICD-10-CM | POA: Insufficient documentation

## 2020-12-11 NOTE — Progress Notes (Signed)
    Postoperative Access Visit   History of Present Illness   Michael Levy is a 55 y.o. year old male who presents for postoperative follow-up for: right brachiocephalic AV fistula by Dr. Trula Slade on 11/08/20. The patient's wounds are well healed.  The patient notes mild steal symptoms.  The patient is able to complete their activities of daily living.  The patient's current symptoms are numbness in the distal 3rd and 4th fingers. He also sometimes when his right arm is bent or if he lifts something he has discomfort at the antecubital area. He also explains that he was waking up with significant pain in his right forearm and hand after sleeping on his right arm in bent position. He has been trying to consciously keep it straight and not sleep on his right side so he says he has not had this pain recently. He otherwise denies any pain in the hand. He does report that his right hand seems cooler than the left but it is manageable.  He currently is not on hemodialysis. He has a follow up with his Nephrologist, Dr. Posey Pronto, on February 24th   Physical Examination   Vitals:   12/11/20 1414  BP: 130/68  Pulse: 69  Resp: 20  Temp: 98.9 F (37.2 C)  TempSrc: Temporal  SpO2: 100%  Weight: 195 lb 6.4 oz (88.6 kg)  Height: '5\' 9"'$  (1.753 m)   Body mass index is 28.86 kg/m.  right arm Incision is well healed, 2+ radial pulse, hand grip is 5/5, sensation in digits is intact, palpable thrill, bruit can  be auscultated. Fistula has matured very nicely and is easily palpable in the right upper arm    Medical Decision Making   Michael Levy is a 55 y.o. year old male who presents s/p right brachiocephalic AV fistula by Dr. Trula Slade on 11/08/20. His incision is well healed. The fistula is well matured on duplex and is of adequate diameter and depth. I have encouraged him to try and not keep his right arm bent or sleep on it for prolonged periods of time. He is having some mild signs and symptoms of steal  with numbness and coolness in the right hand. Presently this is manageable. He is not yet on HD. He has follow up with Dr. Posey Pronto at the end of this month to see if he needs to start dialysis soon.  Patent has mild signs or symptoms of steal syndrome. Presently manageable and no ischemic symptoms  The patient's access will be ready for use 02/06/21  The patient may follow up on a prn basis   Karoline Caldwell, PA-C [facility] Office: [phone]  Clinic MD: Dr. Oneida Alar

## 2020-12-19 ENCOUNTER — Telehealth: Payer: Self-pay

## 2020-12-19 NOTE — Telephone Encounter (Signed)
-----   Message from April Garrison, [place] sent at 11/25/2020  9:49 AM EST ----- Regarding: No Show Pt never answered his phone for his VV with Nishan on 11/25/2020. He needs to be reshceduled.   Thanks, April G.

## 2020-12-19 NOTE — Telephone Encounter (Signed)
Left message for patient to call and reschedule an appointment with Dr. Johnsie Cancel.

## 2021-01-10 ENCOUNTER — Other Ambulatory Visit (HOSPITAL_COMMUNITY): Payer: Self-pay

## 2021-01-13 ENCOUNTER — Other Ambulatory Visit: Payer: Self-pay

## 2021-01-13 ENCOUNTER — Ambulatory Visit (HOSPITAL_COMMUNITY)
Admission: RE | Admit: 2021-01-13 | Discharge: 2021-01-13 | Disposition: A | Discharge status: Home / Self Care | Payer: BC Managed Care – PPO | Source: Ambulatory Visit | Attending: Nephrology | Admitting: Nephrology

## 2021-01-13 DIAGNOSIS — D631 Anemia in chronic kidney disease: Secondary | ICD-10-CM | POA: Diagnosis not present

## 2021-01-13 DIAGNOSIS — N185 Chronic kidney disease, stage 5: Secondary | ICD-10-CM | POA: Diagnosis not present

## 2021-01-13 LAB — POCT HEMOGLOBIN-HEMACUE: Hemoglobin: 8.8 g/dL — ABNORMAL LOW (ref 13.0–17.0)

## 2021-01-13 MED ORDER — EPOETIN ALFA-EPBX 10000 UNIT/ML IJ SOLN
10000.0000 [IU] | INTRAMUSCULAR | Status: DC
  Administered 2021-01-13: 10000 [IU] via SUBCUTANEOUS

## 2021-01-13 MED ORDER — EPOETIN ALFA-EPBX 10000 UNIT/ML IJ SOLN
INTRAMUSCULAR | Status: AC
  Filled 2021-01-13: qty 1

## 2021-01-27 ENCOUNTER — Encounter (HOSPITAL_COMMUNITY)
Admission: RE | Admit: 2021-01-27 | Discharge: 2021-01-27 | Disposition: A | Discharge status: Home / Self Care | Payer: BC Managed Care – PPO | Source: Ambulatory Visit | Attending: Nephrology | Admitting: Nephrology

## 2021-01-27 ENCOUNTER — Other Ambulatory Visit: Payer: Self-pay

## 2021-01-27 VITALS — BP 148/55 | HR 73 | Temp 97.9°F | Resp 18

## 2021-01-27 DIAGNOSIS — N183 Chronic kidney disease, stage 3 unspecified: Secondary | ICD-10-CM | POA: Diagnosis not present

## 2021-01-27 LAB — POCT HEMOGLOBIN-HEMACUE: Hemoglobin: 8.8 g/dL — ABNORMAL LOW (ref 13.0–17.0)

## 2021-01-27 MED ORDER — EPOETIN ALFA-EPBX 10000 UNIT/ML IJ SOLN
INTRAMUSCULAR | Status: AC
  Administered 2021-01-27: 10000 [IU] via SUBCUTANEOUS
  Filled 2021-01-27: qty 1

## 2021-01-27 MED ORDER — EPOETIN ALFA-EPBX 10000 UNIT/ML IJ SOLN: 10000.0000 [IU] | INTRAMUSCULAR | Status: DC

## 2021-02-10 ENCOUNTER — Encounter (HOSPITAL_COMMUNITY)
Admission: RE | Admit: 2021-02-10 | Discharge: 2021-02-10 | Disposition: A | Discharge status: Home / Self Care | Payer: 59 | Source: Ambulatory Visit | Attending: Nephrology | Admitting: Nephrology

## 2021-02-10 ENCOUNTER — Other Ambulatory Visit: Payer: Self-pay

## 2021-02-10 DIAGNOSIS — N183 Chronic kidney disease, stage 3 unspecified: Secondary | ICD-10-CM | POA: Insufficient documentation

## 2021-02-10 LAB — IRON AND TIBC
Iron: 38 ug/dL — ABNORMAL LOW (ref 45–182)
Saturation Ratios: 14 % — ABNORMAL LOW (ref 17.9–39.5)
TIBC: 279 ug/dL (ref 250–450)
UIBC: 241 ug/dL

## 2021-02-10 LAB — FERRITIN: Ferritin: 74 ng/mL (ref 24–336)

## 2021-02-10 LAB — POCT HEMOGLOBIN-HEMACUE: Hemoglobin: 9.4 g/dL — ABNORMAL LOW (ref 13.0–17.0)

## 2021-02-10 MED ORDER — EPOETIN ALFA-EPBX 10000 UNIT/ML IJ SOLN
10000.0000 [IU] | INTRAMUSCULAR | Status: DC
  Administered 2021-02-10: 10000 [IU] via SUBCUTANEOUS

## 2021-02-10 MED ORDER — EPOETIN ALFA-EPBX 10000 UNIT/ML IJ SOLN
INTRAMUSCULAR | Status: AC
  Filled 2021-02-10: qty 1

## 2021-02-21 ENCOUNTER — Other Ambulatory Visit (HOSPITAL_COMMUNITY): Payer: Self-pay

## 2021-02-24 ENCOUNTER — Encounter (HOSPITAL_COMMUNITY)
Admission: RE | Admit: 2021-02-24 | Discharge: 2021-02-24 | Disposition: A | Discharge status: Home / Self Care | Payer: 59 | Source: Ambulatory Visit | Attending: Nephrology | Admitting: Nephrology

## 2021-02-24 ENCOUNTER — Other Ambulatory Visit: Payer: Self-pay

## 2021-02-24 VITALS — BP 125/45 | HR 55 | Temp 97.9°F | Resp 18

## 2021-02-24 DIAGNOSIS — N183 Chronic kidney disease, stage 3 unspecified: Secondary | ICD-10-CM

## 2021-02-24 LAB — POCT HEMOGLOBIN-HEMACUE: Hemoglobin: 9.8 g/dL — ABNORMAL LOW (ref 13.0–17.0)

## 2021-02-24 MED ORDER — SODIUM CHLORIDE 0.9 % IV SOLN
250.0000 mg | INTRAVENOUS | Status: DC
  Administered 2021-02-24: 250 mg via INTRAVENOUS
  Filled 2021-02-24: qty 20

## 2021-02-24 MED ORDER — EPOETIN ALFA-EPBX 10000 UNIT/ML IJ SOLN
10000.0000 [IU] | INTRAMUSCULAR | Status: DC
  Administered 2021-02-24: 10000 [IU] via SUBCUTANEOUS

## 2021-02-24 MED ORDER — EPOETIN ALFA-EPBX 10000 UNIT/ML IJ SOLN
INTRAMUSCULAR | Status: AC
  Filled 2021-02-24: qty 1

## 2021-03-10 ENCOUNTER — Ambulatory Visit (HOSPITAL_COMMUNITY): Payer: 59

## 2021-03-24 ENCOUNTER — Encounter (HOSPITAL_COMMUNITY): Payer: 59

## 2021-10-30 ENCOUNTER — Encounter (HOSPITAL_COMMUNITY): Payer: Self-pay

## 2022-03-02 DEATH — deceased
